# Patient Record
Sex: Female | Born: 1990 | Race: Asian | Hispanic: No | Marital: Married | Smoking: Never smoker
Health system: Southern US, Community
[De-identification: ages and names within clinical notes are randomized; demographics above are authoritative.]

## PROBLEM LIST (undated history)

## (undated) DIAGNOSIS — R87629 Unspecified abnormal cytological findings in specimens from vagina: Secondary | ICD-10-CM

## (undated) DIAGNOSIS — O24419 Gestational diabetes mellitus in pregnancy, unspecified control: Secondary | ICD-10-CM

## (undated) DIAGNOSIS — Z789 Other specified health status: Secondary | ICD-10-CM

## (undated) HISTORY — PX: LEEP: SHX91

## (undated) HISTORY — PX: NO PAST SURGERIES: SHX2092

---

## 2015-08-10 ENCOUNTER — Inpatient Hospital Stay (HOSPITAL_COMMUNITY)
Admission: AD | Admit: 2015-08-10 | Discharge: 2015-08-10 | Disposition: A | Discharge status: Home / Self Care | Payer: Medicaid Other | Source: Ambulatory Visit | Attending: Obstetrics & Gynecology | Admitting: Obstetrics & Gynecology

## 2015-08-10 ENCOUNTER — Encounter (HOSPITAL_COMMUNITY): Payer: Self-pay | Admitting: *Deleted

## 2015-08-10 DIAGNOSIS — O210 Mild hyperemesis gravidarum: Secondary | ICD-10-CM | POA: Diagnosis present

## 2015-08-10 DIAGNOSIS — Z3A11 11 weeks gestation of pregnancy: Secondary | ICD-10-CM | POA: Diagnosis not present

## 2015-08-10 DIAGNOSIS — O2341 Unspecified infection of urinary tract in pregnancy, first trimester: Secondary | ICD-10-CM | POA: Diagnosis not present

## 2015-08-10 DIAGNOSIS — O219 Vomiting of pregnancy, unspecified: Secondary | ICD-10-CM

## 2015-08-10 LAB — URINALYSIS, ROUTINE W REFLEX MICROSCOPIC
Bilirubin Urine: NEGATIVE
GLUCOSE, UA: NEGATIVE mg/dL
Ketones, ur: NEGATIVE mg/dL
LEUKOCYTES UA: NEGATIVE
Nitrite: NEGATIVE
PH: 7 (ref 5.0–8.0)
PROTEIN: NEGATIVE mg/dL
Specific Gravity, Urine: 1.01 (ref 1.005–1.030)

## 2015-08-10 LAB — POCT PREGNANCY, URINE: Preg Test, Ur: POSITIVE — AB

## 2015-08-10 LAB — URINE MICROSCOPIC-ADD ON

## 2015-08-10 MED ORDER — PROMETHAZINE HCL 25 MG PO TABS
25.0000 mg | ORAL_TABLET | Freq: Once | ORAL | Status: AC
  Administered 2015-08-10: 25 mg via ORAL
  Filled 2015-08-10: qty 1

## 2015-08-10 MED ORDER — CEPHALEXIN 500 MG PO CAPS: 500.0000 mg | ORAL_CAPSULE | Freq: Four times a day (QID) | ORAL | Status: DC

## 2015-08-10 MED ORDER — PROMETHAZINE HCL 25 MG PO TABS: 25.0000 mg | ORAL_TABLET | Freq: Four times a day (QID) | ORAL | Status: DC | PRN

## 2015-08-10 MED ORDER — CEPHALEXIN 500 MG PO CAPS
500.0000 mg | ORAL_CAPSULE | Freq: Once | ORAL | Status: AC
  Administered 2015-08-10: 500 mg via ORAL
  Filled 2015-08-10: qty 1

## 2015-08-10 NOTE — MAU Provider Note (Signed)
Chief Complaint: Emesis   First Provider Initiated Contact with Patient 08/10/15 1105      SUBJECTIVE HPI: Kelli Christian is a 24 y.o. G1P0 at 4839w2d by LMP who presents to Maternity Admissions reporting nausea, rare vomiting times several weeks and dysuria, urgency and frequency times a few months. Has tried anything for the nausea and vomiting. Is able to keep down food and fluids, it has a poor appetite and sometimes feels weak as a result. Has not started prenatal care. Denies abdominal pain, vaginal bleeding.  History reviewed. No pertinent past medical history. OB History  Gravida Para Term Preterm AB SAB TAB Ectopic Multiple Living  1             # Outcome Date GA Lbr Len/2nd Weight Sex Delivery Anes PTL Lv  1 Current              Past Surgical History  Procedure Laterality Date  . No past surgeries     Social History   Social History  . Marital Status: Single    Spouse Name: N/A  . Number of Children: N/A  . Years of Education: N/A   Occupational History  . Not on file.   Social History Main Topics  . Smoking status: Never Smoker   . Smokeless tobacco: Not on file  . Alcohol Use: No  . Drug Use: No  . Sexual Activity: Not on file   Other Topics Concern  . Not on file   Social History Narrative  . No narrative on file   No current facility-administered medications on file prior to encounter.   No current outpatient prescriptions on file prior to encounter.   No Known Allergies  I have reviewed the past Medical Hx, Surgical Hx, Social Hx, Allergies and Medications.   Review of Systems  Constitutional: Negative for fever and chills.  Gastrointestinal: Positive for nausea and vomiting. Negative for abdominal pain, diarrhea and constipation.  Genitourinary: Positive for dysuria, urgency and frequency. Negative for hematuria, vaginal discharge and pelvic pain.  Musculoskeletal: Negative for back pain.  Neurological: Positive for weakness. Negative for dizziness.     OBJECTIVE Patient Vitals for the past 24 hrs:  BP Temp Pulse Resp Height Weight  08/10/15 0958 113/74 mmHg 97.5 F (36.4 C) 92 18 5\' 2"  (1.575 m) 123 lb 9.6 oz (56.065 kg)   Constitutional: Well-developed, well-nourished female in no acute distress.  Cardiovascular: normal rate Respiratory: normal rate and effort.  GI: Abd soft, non-tender, gravid appropriate for gestational age.  Neurologic: Alert and oriented x 4.  GU: Neg CVAT.  SPECULUM EXAM: Deferred  Unable to Doppler fetal heart tones.  LAB RESULTS Results for orders placed or performed during the hospital encounter of 08/10/15 (from the past 24 hour(s))  Urinalysis, Routine w reflex microscopic ([facility])     Status: Abnormal   Collection Time: 08/10/15 10:35 AM  Result Value Ref Range   Color, Urine YELLOW YELLOW   APPearance CLEAR CLEAR   Specific Gravity, Urine 1.010 1.005 - 1.030   pH 7.0 5.0 - 8.0   Glucose, UA NEGATIVE NEGATIVE mg/dL   Hgb urine dipstick TRACE (A) NEGATIVE   Bilirubin Urine NEGATIVE NEGATIVE   Ketones, ur NEGATIVE NEGATIVE mg/dL   Protein, ur NEGATIVE NEGATIVE mg/dL   Nitrite NEGATIVE NEGATIVE   Leukocytes, UA NEGATIVE NEGATIVE  Urine microscopic-add on     Status: Abnormal   Collection Time: 08/10/15 10:35 AM  Result Value Ref Range   Squamous Epithelial /  LPF 0-5 (A) NONE SEEN   WBC, UA 0-5 0 - 5 WBC/hpf   RBC / HPF 0-5 0 - 5 RBC/hpf   Bacteria, UA RARE (A) NONE SEEN  Pregnancy, urine POC     Status: Abnormal   Collection Time: 08/10/15 10:45 AM  Result Value Ref Range   Preg Test, Ur POSITIVE (A) NEGATIVE    IMAGING Formal bedside ultrasound shows 7 week 5 day fetal pole with fetal heart rate 171.  MAU COURSE UPT, UA, Phenergan, Keflex.  MDM 24 year old pregnant female with mild nausea and vomiting of pregnancy. UTI. Ultrasound shows size less than dates fetus with positive cardiac activity. No evidence of emergent condition. Patient tolerating PO's.  ASSESSMENT 1. UTI  in pregnancy, first trimester   2. Nausea and vomiting of pregnancy, antepartum     PLAN Discharge home in stable condition. First trimester Precautions List of providers given. Urine culture pending. Encouraged small frequent meals. Follow-up Information    Follow up with Obstetrician of your choice.   Why:  Start prenatal care      Follow up with THE [facility] [place] [facility] ADMISS[facility].   Why:  As needed in emergencies   Contact information:   [address] [id number] mc [place] [address] [phone]        Medication List    TAKE these medications        cephALEXin 500 MG capsule  Commonly known as:  KEFLEX  Take 1 capsule (500 mg total) by mouth 4 (four) times daily.     promethazine 25 MG tablet  Commonly known as:  PHENERGAN  Take 1 tablet (25 mg total) by mouth every 6 (six) hours as needed.       [place], CNM 08/10/2015  11:01 AM

## 2015-08-10 NOTE — MAU Note (Signed)
Pt presents to MAU with complaints of nausea and vomiting. + home pregnancy test , LMP September 17. Denies any vaginal bleeding

## 2015-08-10 NOTE — MAU Note (Addendum)
C/o N&V; c/o intermittent UTI symptoms since October; unable to hear FHR; G1; 5837w2d by periods; does not speak [place] but has a Nurse, learning disabilitytranslator with her;

## 2015-08-10 NOTE — Discharge Instructions (Signed)
Nhi?m Trng ???ng Ti?t Ni?u (Urinary Tract Infection) Nhi?m trng ???ng ti?t ni?u (UTI) c th? pht tri?n ?? b?t c? vi? tri? na?o d?c ???ng ti?t ni?u. ???ng ti?t ni?u l h? th?ng thot n??c c?a c? th? ?? lo?i b? ch?t th?i v n??c d? th?a. ???ng ti?t ni?u bao g?m hai th?n, ni?u qu?n, bng quang v ni?u ??o. Th?n l m?t c?p c? quan hnh h?t ??u. M?i th?n co? kch th??c kho?ng b?ng bn tay c?a b?n. Chng n?m d??i x??ng s??n, m?i bn c?t s?ng c m?t qu?[facility] Nhi?m trng gy b?i vi sinh v?t, l cc sinh v?t nh?, bao g?m n?m, vi rt v vi khu?n. Nh?ng sinh v?t ny nh? t?i m?c chng ch? c th? ???c nhn th?y qua knh hi?n vi. Vi khu?n l nh?ng vi sinh v?t ph? bi?n nh?t gy ra UTI. TRI?U CH?NG Cc tri?u ch?ng c?a UTI c th? khc nhau, ty theo ?? tu?i v gi?i tnh c?a b?nh nhn v b?i v? tr nhi?m trng. Cc tri?u ch?ng ? ph? n? tr? th??ng bao g?m nhu c?u ti?u ti?n th??ng xuyn v d? d?i, c?m gic ?au v rt ? bng quang ho?c ni?u ??o khi ?i ti?u. Ph? n? v nam gi?i l?n tu?i c nhi?u kh? n?ng b? m?t m?i, run r?y v y?u, ??ng th?i b? ?au c? v ?au b?ng. S?t c th? c ngh?a l nhi?m trng ? th?n. Cc tri?u ch?ng khc c?a nhi?m trng th?n bao g?m ?au ? l?ng ho?c hai bn d??i x??ng s??n, bu?n nn v nn m?a. CH?N ?ON ?? ch?n ?on UTI, chuyn gia ch?m [place] s?c kh?e s? h?i b?n v? nh?ng tri?u ch?ng c?a b?n. Chuyn gia ch?m [place] s?c kh?e c?ng s? yu c?u cung c?p m?u n??c ti?u. M?u n??c ti?u s? ???c xt nghi?m xem c vi khu?n v cc t? bo mu tr?ng khng. Cc t? bo mu tr?ng ???c t?o b?i c? th? ?? gip ch?ng l?i nhi?m trng. ?I?U TR? Thng th??ng, UTI c th? ???c ?i?u tr? b?ng thu?c. B?i v h?u h?t nguyn nhn gy ra UTI l do nhi?m trng b?i vi khu?n, chng th??ng c th? ???c ?i?u tr? b?ng cch s? d?ng thu?c khng sinh. Vi?c l?a ch?n khng sinh v th?i gian ?i?u tr? ph? thu?c vo tri?u ch?ng v lo?i vi khu?n gy nhi?m trng. H??NG D?N CH?M [place] T?I NH  N?u b?n ? ???c k khng sinh, hy s? d?ng chng ?ng  nh? chuyn gia ch?m  s?c kh?e h??ng d?n. S? d?ng h?t thu?c ngay c? khi b?n c?m th?y kh h?n sau khi ch? dng m?t ph?n thu?c.  U?ng ?? n??c v dung d?ch ?? n??c ti?u trong ho?c c mu vng nh?t.  Trnh caffeine, tr v ?? u?ng c ga. Chng c xu h??ng kch thch bng quang.  ?i ti?u th??ng xuyn. Trnh nh?n ti?u trong th?i gian di.  ?i ti?u tr??c v sau khi quan h? tnh d?c.  Sau khi ?i ??i ti?n, ph? n? c?n lm s?ch t? tr??c ra sau. Ch? s? d?ng gi?y v? sinh m?t l?n. HY ?I KHM N?U:  B?n b? ?au l?ng.  B?n b? s?t.  Cc tri?u ch?ng c?a b?n khng b?t ??u ?? trong vng 3 ngy. HY NGAY L?P T?C ?I KHM N?U:  B?n b? ?au l?ng ho?c ?au b?ng d??i nghim tr?ng.  B?n b? ?n l?nh.  B?n b? bu?n nn ho?c nn m?a.  B?n lin t?c b? rt ho?c kh ch?u khi ?i ti?u. ??M  B?O B?N:  Hi?u cc h??ng d?n ny.  S? theo di tnh tr?ng c?a mnh.  S? yu c?u tr? gip ngay l?p t?c n?u b?n c?m th?y khng ?? ho?c tnh tr?ng tr?m tr?ng h?n.   Thng tin ny khng nh?m m?c ?ch thay th? cho l?i khuyn m chuyn gia ch?m [place] s?c kh?e ni v?i qu v?. Hy b?o ??m qu v? ph?i th?o lu?n b?t k? v?n ?? g m qu v? c v?i chuyn gia ch?m [place] s?c kh?e c?a qu v?.   Document Released: 08/26/2005 Document Revised: 04/28/2013 Elsevier Interactive Patient Education 2016 Elsevier Inc.  Nn nghn (Hyperemesis Gravidarum) Ch?ng nn nghn l m?t d?ng n?ng c?a bu?n nn v nn n?ng x?y ra trong th?i k? mang [place]. Ch?ng nn t?i t? h?n tnh tr?ng ?m nghn. N c th? khi?n cho qu v? b? bu?n nn ho?c nn su?t c? ngy trong nhi?u ngy. N c th? khi?n cho qu v? khng th? ?n v u?ng ?? th?c ?n v ch?t l?ng. Ch?ng nn th??ng x?y ra trong n?a ??u (20 tu?n ??u) c?a [place] k?. N th??ng h?t sau khi ng??i mang [place] ? n?a sau c?a [place] k?. Tuy nhin, ?i khi ch?ng nn ti?p t?c sut ton b? thai k?.  NGUYN NHN.  Nguyn nhn c?a tnh tr?ng ny khng hon ton ???c xc ??nh nh?ng ???c cho l lin quan ??n s? thay ??i hocmon c?a c? th?  khi mang [place]. N c th? do n?ng ?? hocmon thai nghn cao ho?c t?ng estrogen trong c? th?.  D?U HI?U V TRI?U CH?NG   Bu?n nn v nn m?a n?ng.  Bu?n nn khng h?t.  Nn m?a khi?n qu v? khng th? gi? ???c b?t k? th?c ?n no trong c? th?.  Gi?m cn v m?t n??c trong c? th? (m?t n??c).  Khng mu?n ?n ho?c khng thch th?c ?n m tr??c ?y qu v? r?t thch. CH?N ?ON  Chuyn gia ch?m [place] s?c kh?e c?a qu v? s? khm th?c th? v h?i v? nh?ng tri?u ch?ng c?a qu v?. Chuyn gia ch?m [place] s?c kh?e c?ng c th? yu c?u xt nghi?m mu v xt nghi?m n??c ti?u ?? ??m b?o khng c nguyn nhn no khc gy ra v?n ?? ny.  ?I?U TR?  Qu v? c th? ch? c?n thu?c ?? ki?m sot v?n ?? ny. N?u thu?c khng ki?m sot ???c hi?n t??ng bu?n nn v nn m?a, qu v? s? ???c ?i?u tr? trong b?nh vi?n ?? ng?n ch?n tnh tr?ng m?t n??c, t?ng a xt trong mu (nhi?m toan), s?t cn, v nh?ng thay ??i v? ?i?n gi?i trong c? th? c th? gy h?i cho em b trong b?ng m? (bo [place]). Qu v? c th? c?n truy?n d?ch ???ng t?nh m?ch (IV).  H??NG D?N CH?M [place] T?I NH   Ch? s? d?ng thu?c khng c?n k ??n ho?c thu?c c?n k ??n theo ch? d?n c?a chuyn gia ch?m [place] s?c kh?e.  Hy c? g?ng ?n m?t vi chi?c bnh quy ho?c bnh m n??ng vo bu?i sng tr??c khi ra kh?i gi??ng.  Trnh th?c ?n v mi lm d? dy kh ch?u.  Trnh th?c ?n bo v nhi?u gia v?.  ?n 5 - 6 b?a ?n nh? m?i ngy.  Khng u?ng trong khi ?n. U?ng gi?a cc b?a ?n.  ??i v?i ?? ?n nh?, hy ?n nh?ng lo?i th?c ph?m ch?a nhi?u protein, ch?ng h?n nh? pho mt.  ?n ho?c ht nh?ng ?? ?n, ?? u?ng c  g?ng. G?ng gip lm gi?m c?m gic bu?n nn.  Trnh chu?n b? ?? ?n. Mi th?c ?n c th? ?nh h??ng ??n kh?u v? c?a qu v?.  Trnh dng vin s?t v s?t trong vitamin t?ng h?p c?a qu v? cho ??n sau 3 - 4 thng mang [place]. Tuy nhin, hy tham kh?o  ki?n c?a chuyn gia ch?m [place] s?c kh?e tr??c khi d?ng b?t k? lo?i thu?c c s?t no ? k ??n. ?I KHM N?U:   C?n ?au b?ng c?a qu v? t?ng  ln.  Qu v? b? ?au ??u nhi?u.  Qu v? c v?n ?? v? th? l?c.  Qu v? b? s?t cn. NGAY L?P T?C ?I KHM N?U:   Qu v? khng th? gi? ???c ch?t l?ng trong ng??i.  Qu v? nn ra mu.  Qu v? b? bu?n nn v nn m?a lin t?c.  Qu v? r?t m?t m?i.  Qu v? r?t kht n??c.  Qu v? b? chng m?t ho?c b? ng?t x?u.  Qu v? b? s?t ho?c c cc tri?u ch?ng ko di h?n 2 - 3 ngy.  Qu v? b? s?t v cc tri?u ch?ng c?a qu v? ??t nhin n?ng h?n. ??M B?O QU V?:   Hi?u r cc h??ng d?n ny.  S? theo di tnh tr?ng c?a mnh.  S? yu c?u tr? gip ngay l?p t?c n?u qu v? c?m th?y khng kh?e ho?c th?y tr?m tr?ng h?n.   Thng tin ny khng nh?m m?c ?ch thay th? cho l?i khuyn m chuyn gia ch?m [place] s?c kh?e ni v?i qu v?. Hy b?o ??m qu v? ph?i th?o lu?n b?t k? v?n ?? g m qu v? c v?i chuyn gia ch?m [place] s?c kh?e c?a qu v?.   Document Released: 08/26/2005 Document Revised: 06/16/2013 Elsevier Interactive Patient Education [employer]2016 Elsevier Inc.

## 2015-08-11 LAB — CULTURE, OB URINE

## 2015-08-31 ENCOUNTER — Encounter (HOSPITAL_COMMUNITY): Payer: Self-pay | Admitting: *Deleted

## 2015-09-10 NOTE — L&D Delivery Note (Signed)
Operative Delivery Note At 11:06 PM a viable female was delivered via VBAC, Vacuum Assisted.  Presentation: vertex; Position: Left,, Occiput,, Anterior; Station: +3.  Verbal consent: unable to obtain verbal consent due to language barrier.  Risks and benefits discussed in detail.  Risks include, but are not limited to the risks of anesthesia, bleeding, infection, damage to maternal tissues, fetal cephalhematoma.  There is also the risk of inability to effect vaginal delivery of the head, or shoulder dystocia that cannot be resolved by established maneuvers, leading to the need for emergency cesarean section.  APGAR: 9, 9; weight  .   Placenta status: Intact, Spontaneous.   Cord: 3 vessels with the following complications: None.  Cord pH: pending  Anesthesia: Epidural  Instruments: kiwi Episiotomy: Median Lacerations: None Suture Repair: 3.0 vicryl rapide Est. Blood Loss (mL):    Mom to postpartum.  Baby to Couplet care / Skin to Skin.  Kelli Christian 03/17/2016, 11:25 PM

## 2015-09-20 ENCOUNTER — Encounter: Payer: Self-pay | Admitting: Certified Nurse Midwife

## 2015-10-04 ENCOUNTER — Encounter: Payer: Self-pay | Admitting: Certified Nurse Midwife

## 2015-10-04 ENCOUNTER — Ambulatory Visit (INDEPENDENT_AMBULATORY_CARE_PROVIDER_SITE_OTHER): Payer: Medicaid Other | Admitting: Certified Nurse Midwife

## 2015-10-04 ENCOUNTER — Other Ambulatory Visit (HOSPITAL_COMMUNITY)
Admission: RE | Admit: 2015-10-04 | Discharge: 2015-10-04 | Disposition: A | Discharge status: Home / Self Care | Payer: Medicaid Other | Source: Ambulatory Visit | Attending: Certified Nurse Midwife | Admitting: Certified Nurse Midwife

## 2015-10-04 ENCOUNTER — Telehealth: Payer: Self-pay | Admitting: *Deleted

## 2015-10-04 VITALS — BP 115/72 | HR 83 | Temp 98.0°F | Wt 126.1 lb

## 2015-10-04 DIAGNOSIS — Z113 Encounter for screening for infections with a predominantly sexual mode of transmission: Secondary | ICD-10-CM | POA: Diagnosis present

## 2015-10-04 DIAGNOSIS — Z01419 Encounter for gynecological examination (general) (routine) without abnormal findings: Secondary | ICD-10-CM | POA: Diagnosis not present

## 2015-10-04 DIAGNOSIS — Z124 Encounter for screening for malignant neoplasm of cervix: Secondary | ICD-10-CM

## 2015-10-04 DIAGNOSIS — O0932 Supervision of pregnancy with insufficient antenatal care, second trimester: Secondary | ICD-10-CM | POA: Diagnosis present

## 2015-10-04 DIAGNOSIS — Z3402 Encounter for supervision of normal first pregnancy, second trimester: Secondary | ICD-10-CM

## 2015-10-04 LAB — POCT URINALYSIS DIP (DEVICE)
Bilirubin Urine: NEGATIVE
Glucose, UA: NEGATIVE mg/dL
Hgb urine dipstick: NEGATIVE
Ketones, ur: NEGATIVE mg/dL
Leukocytes, UA: NEGATIVE
Nitrite: NEGATIVE
Protein, ur: NEGATIVE mg/dL
Specific Gravity, Urine: 1.02 (ref 1.005–1.030)
Urobilinogen, UA: 0.2 mg/dL (ref 0.0–1.0)
pH: 7.5 (ref 5.0–8.0)

## 2015-10-04 LAB — OB RESULTS CONSOLE GC/CHLAMYDIA: Gonorrhea: NEGATIVE

## 2015-10-04 NOTE — Telephone Encounter (Signed)
Opened in error

## 2015-10-04 NOTE — Progress Notes (Signed)
   Subjective:    Kelli Christian is a G1P0 [redacted]w[redacted]d being seen today for her first obstetrical visit.  Her obstetrical history is significant for none. Patient does intend to breast feed. Pregnancy history fully reviewed.  Patient reports nausea.  Filed Vitals:   10/04/15 0841  BP: 115/72  Pulse: 83  Temp: 98 F (36.7 C)  Weight: 126 lb 1.6 oz (57.199 kg)    HISTORY: OB History  Gravida Para Term Preterm AB SAB TAB Ectopic Multiple Living  1             # Outcome Date GA Lbr Len/2nd Weight Sex Delivery Anes PTL Lv  1 Current              History reviewed. No pertinent past medical history. Past Surgical History  Procedure Laterality Date  . No past surgeries     Family History  Problem Relation Age of Onset  . Alcohol abuse Neg Hx   . Arthritis Neg Hx   . Asthma Neg Hx   . Birth defects Neg Hx   . Cancer Neg Hx   . COPD Neg Hx   . Depression Neg Hx   . Diabetes Neg Hx   . Drug abuse Neg Hx   . Early death Neg Hx   . Hearing loss Neg Hx   . Heart disease Neg Hx   . Hyperlipidemia Neg Hx   . Hypertension Neg Hx   . Kidney disease Neg Hx   . Learning disabilities Neg Hx   . Mental illness Neg Hx   . Mental retardation Neg Hx   . Miscarriages / Stillbirths Neg Hx   . Stroke Neg Hx   . Vision loss Neg Hx   . Varicose Veins Neg Hx      Exam    Uterus:     Pelvic Exam:    Perineum: No Hemorrhoids   Vulva: normal   Vagina:  normal mucosa   pH:    Cervix: no bleeding following Pap   Adnexa:    Bony Pelvis: gynecoid  System: Breast:     Skin: normal coloration and turgor, no rashes    Neurologic: oriented, normal   Extremities: normal strength, tone, and muscle mass   HEENT    Mouth/Teeth mucous membranes moist, pharynx normal without lesions   Neck supple and no masses   Cardiovascular: regular rate and rhythm   Respiratory:  appears well, vitals normal, no respiratory distress, acyanotic, normal RR, ear and throat exam is normal, neck free of mass or  lymphadenopathy, chest clear, no wheezing, crepitations, rhonchi, normal symmetric air entry   Abdomen: soft, non-tender; bowel sounds normal; no masses,  no organomegaly   Urinary: urethral meatus normal      Assessment:    Pregnancy: G1P0 Patient Active Problem List   Diagnosis Date Noted  . Supervision of normal first pregnancy in second trimester 10/04/2015  . Late prenatal care affecting pregnancy in second trimester, antepartum 10/04/2015        Plan:     Initial labs drawn. Prenatal vitamins. Problem list reviewed and updated. Genetic Screening discussed Quad Screen: ordered.  Ultrasound discussed; fetal survey: ordered. Declined flu shot  Follow up in 4 weeks. 50% of 30 min visit spent on counseling and coordination of care.   Clemmons,Lori Grissett 10/04/2015

## 2015-10-04 NOTE — Patient Instructions (Signed)
First Trimester of Pregnancy The first trimester of pregnancy is from week 1 until the end of week 12 (months 1 through 3). A week after a sperm fertilizes an egg, the egg will implant on the wall of the uterus. This embryo will begin to develop into a baby. Genes from you and your partner are forming the baby. The female genes determine whether the baby is a boy or a girl. At 6-8 weeks, the eyes and face are formed, and the heartbeat can be seen on ultrasound. At the end of 12 weeks, all the baby's organs are formed.  Now that you are pregnant, you will want to do everything you can to have a healthy baby. Two of the most important things are to get good prenatal care and to follow your health care provider's instructions. Prenatal care is all the medical care you receive before the baby's birth. This care will help prevent, find, and treat any problems during the pregnancy and childbirth. BODY CHANGES Your body goes through many changes during pregnancy. The changes vary from woman to woman.   You may gain or lose a couple of pounds at first.  You may feel sick to your stomach (nauseous) and throw up (vomit). If the vomiting is uncontrollable, call your health care provider.  You may tire easily.  You may develop headaches that can be relieved by medicines approved by your health care provider.  You may urinate more often. Painful urination may mean you have a bladder infection.  You may develop heartburn as a result of your pregnancy.  You may develop constipation because certain hormones are causing the muscles that push waste through your intestines to slow down.  You may develop hemorrhoids or swollen, bulging veins (varicose veins).  Your breasts may begin to grow larger and become tender. Your nipples may stick out more, and the tissue that surrounds them (areola) may become darker.  Your gums may bleed and may be sensitive to brushing and flossing.  Dark spots or blotches (chloasma,  mask of pregnancy) may develop on your face. This will likely fade after the baby is born.  Your menstrual periods will stop.  You may have a loss of appetite.  You may develop cravings for certain kinds of food.  You may have changes in your emotions from day to day, such as being excited to be pregnant or being concerned that something may go wrong with the pregnancy and baby.  You may have more vivid and strange dreams.  You may have changes in your hair. These can include thickening of your hair, rapid growth, and changes in texture. Some women also have hair loss during or after pregnancy, or hair that feels dry or thin. Your hair will most likely return to normal after your baby is born. WHAT TO EXPECT AT YOUR PRENATAL VISITS During a routine prenatal visit:  You will be weighed to make sure you and the baby are growing normally.  Your blood pressure will be taken.  Your abdomen will be measured to track your baby's growth.  The fetal heartbeat will be listened to starting around week 10 or 12 of your pregnancy.  Test results from any previous visits will be discussed. Your health care provider may ask you:  How you are feeling.  If you are feeling the baby move.  If you have had any abnormal symptoms, such as leaking fluid, bleeding, severe headaches, or abdominal cramping.  If you are using any tobacco products,   including cigarettes, chewing tobacco, and electronic cigarettes.  If you have any questions. Other tests that may be performed during your first trimester include:  Blood tests to find your blood type and to check for the presence of any previous infections. They will also be used to check for low iron levels (anemia) and Rh antibodies. Later in the pregnancy, blood tests for diabetes will be done along with other tests if problems develop.  Urine tests to check for infections, diabetes, or protein in the urine.  An ultrasound to confirm the proper growth  and development of the baby.  An amniocentesis to check for possible genetic problems.  Fetal screens for spina bifida and Down syndrome.  You may need other tests to make sure you and the baby are doing well.  HIV (human immunodeficiency virus) testing. Routine prenatal testing includes screening for HIV, unless you choose not to have this test. HOME CARE INSTRUCTIONS  Medicines  Follow your health care provider's instructions regarding medicine use. Specific medicines may be either safe or unsafe to take during pregnancy.  Take your prenatal vitamins as directed.  If you develop constipation, try taking a stool softener if your health care provider approves. Diet  Eat regular, well-balanced meals. Choose a variety of foods, such as meat or vegetable-based protein, fish, milk and low-fat dairy products, vegetables, fruits, and whole grain breads and cereals. Your health care provider will help you determine the amount of weight gain that is right for you.  Avoid raw meat and uncooked cheese. These carry germs that can cause birth defects in the baby.  Eating four or five small meals rather than three large meals a day may help relieve nausea and vomiting. If you start to feel nauseous, eating a few soda crackers can be helpful. Drinking liquids between meals instead of during meals also seems to help nausea and vomiting.  If you develop constipation, eat more high-fiber foods, such as fresh vegetables or fruit and whole grains. Drink enough fluids to keep your urine clear or pale yellow. Activity and Exercise  Exercise only as directed by your health care provider. Exercising will help you:  Control your weight.  Stay in shape.  Be prepared for labor and delivery.  Experiencing pain or cramping in the lower abdomen or low back is a good sign that you should stop exercising. Check with your health care provider before continuing normal exercises.  Try to avoid standing for long  periods of time. Move your legs often if you must stand in one place for a long time.  Avoid heavy lifting.  Wear low-heeled shoes, and practice good posture.  You may continue to have sex unless your health care provider directs you otherwise. Relief of Pain or Discomfort  Wear a good support bra for breast tenderness.   Take warm sitz baths to soothe any pain or discomfort caused by hemorrhoids. Use hemorrhoid cream if your health care provider approves.   Rest with your legs elevated if you have leg cramps or low back pain.  If you develop varicose veins in your legs, wear support hose. Elevate your feet for 15 minutes, 3-4 times a day. Limit salt in your diet. Prenatal Care  Schedule your prenatal visits by the twelfth week of pregnancy. They are usually scheduled monthly at first, then more often in the last 2 months before delivery.  Write down your questions. Take them to your prenatal visits.  Keep all your prenatal visits as directed by your   health care provider. Safety  Wear your seat belt at all times when driving.  Make a list of emergency phone numbers, including numbers for family, friends, the hospital, and police and fire departments. General Tips  Ask your health care provider for a referral to a local prenatal education class. Begin classes no later than at the beginning of month 6 of your pregnancy.  Ask for help if you have counseling or nutritional needs during pregnancy. Your health care provider can offer advice or refer you to specialists for help with various needs.  Do not use hot tubs, steam rooms, or saunas.  Do not douche or use tampons or scented sanitary pads.  Do not cross your legs for long periods of time.  Avoid cat litter boxes and soil used by cats. These carry germs that can cause birth defects in the baby and possibly loss of the fetus by miscarriage or stillbirth.  Avoid all smoking, herbs, alcohol, and medicines not prescribed by  your health care provider. Chemicals in these affect the formation and growth of the baby.  Do not use any tobacco products, including cigarettes, chewing tobacco, and electronic cigarettes. If you need help quitting, ask your health care provider. You may receive counseling support and other resources to help you quit.  Schedule a dentist appointment. At home, brush your teeth with a soft toothbrush and be gentle when you floss. SEEK MEDICAL CARE IF:   You have dizziness.  You have mild pelvic cramps, pelvic pressure, or nagging pain in the abdominal area.  You have persistent nausea, vomiting, or diarrhea.  You have a bad smelling vaginal discharge.  You have pain with urination.  You notice increased swelling in your face, hands, legs, or ankles. SEEK IMMEDIATE MEDICAL CARE IF:   You have a fever.  You are leaking fluid from your vagina.  You have spotting or bleeding from your vagina.  You have severe abdominal cramping or pain.  You have rapid weight gain or loss.  You vomit blood or material that looks like coffee grounds.  You are exposed to German measles and have never had them.  You are exposed to fifth disease or chickenpox.  You develop a severe headache.  You have shortness of breath.  You have any kind of trauma, such as from a fall or a car accident.   This information is not intended to replace advice given to you by your health care provider. Make sure you discuss any questions you have with your health care provider.   Document Released: 08/20/2001 Document Revised: 09/16/2014 Document Reviewed: 07/06/2013 Elsevier Interactive Patient Education 2016 Elsevier Inc.  

## 2015-10-04 NOTE — Progress Notes (Signed)
Anatomy scan scheduled. Pt refuses flu shot.

## 2015-10-05 LAB — PRESCRIPTION MONITORING PROFILE (19 PANEL)
Amphetamine/Meth: NEGATIVE ng/mL
Barbiturate Screen, Urine: NEGATIVE ng/mL
Benzodiazepine Screen, Urine: NEGATIVE ng/mL
Buprenorphine, Urine: NEGATIVE ng/mL
Cannabinoid Scrn, Ur: NEGATIVE ng/mL
Carisoprodol, Urine: NEGATIVE ng/mL
Cocaine Metabolites: NEGATIVE ng/mL
Creatinine, Urine: 100.01 mg/dL (ref >20.0)
Fentanyl, Ur: NEGATIVE ng/mL
MDMA URINE: NEGATIVE ng/mL
Meperidine, Ur: NEGATIVE ng/mL
Methadone Screen, Urine: NEGATIVE ng/mL
Methaqualone: NEGATIVE ng/mL
Nitrites, Initial: NEGATIVE ug/mL
Opiate Screen, Urine: NEGATIVE ng/mL
Oxycodone Screen, Ur: NEGATIVE ng/mL
Phencyclidine, Ur: NEGATIVE ng/mL
Propoxyphene: NEGATIVE ng/mL
Tapentadol, urine: NEGATIVE ng/mL
Tramadol Scrn, Ur: NEGATIVE ng/mL
Zolpidem, Urine: NEGATIVE ng/mL
pH, Initial: 8.4 pH (ref 4.5–8.9)

## 2015-10-05 LAB — CULTURE, OB URINE: Colony Count: 75000

## 2015-10-05 LAB — GC/CHLAMYDIA PROBE AMP (~~LOC~~) NOT AT ARMC
Chlamydia: NEGATIVE
Neisseria Gonorrhea: NEGATIVE

## 2015-10-06 LAB — CYTOLOGY - PAP

## 2015-10-09 ENCOUNTER — Ambulatory Visit (HOSPITAL_COMMUNITY)
Admission: RE | Admit: 2015-10-09 | Discharge: 2015-10-09 | Disposition: A | Discharge status: Home / Self Care | Payer: Medicaid Other | Source: Ambulatory Visit | Attending: Certified Nurse Midwife | Admitting: Certified Nurse Midwife

## 2015-10-09 ENCOUNTER — Other Ambulatory Visit: Payer: Self-pay | Admitting: Certified Nurse Midwife

## 2015-10-09 DIAGNOSIS — O0932 Supervision of pregnancy with insufficient antenatal care, second trimester: Secondary | ICD-10-CM | POA: Diagnosis not present

## 2015-10-09 DIAGNOSIS — Z3402 Encounter for supervision of normal first pregnancy, second trimester: Secondary | ICD-10-CM

## 2015-10-09 DIAGNOSIS — Z3A16 16 weeks gestation of pregnancy: Secondary | ICD-10-CM | POA: Insufficient documentation

## 2015-10-09 DIAGNOSIS — Z363 Encounter for antenatal screening for malformations: Secondary | ICD-10-CM

## 2015-10-09 DIAGNOSIS — O283 Abnormal ultrasonic finding on antenatal screening of mother: Secondary | ICD-10-CM | POA: Diagnosis not present

## 2015-10-16 ENCOUNTER — Telehealth: Payer: Self-pay

## 2015-10-16 NOTE — Telephone Encounter (Signed)
Per Dr. Shawnie Pons, pt needs to come in for prenatal labs/Quad Screen.  Attempted to contact pt with [employer], they did not have an interpreter available at that time.  Re:  Pt has an appt scheduled on 11/01/15 which will be too late for Quad.

## 2015-10-30 NOTE — Telephone Encounter (Signed)
Pt has appt will need to follow up at next office visit.

## 2015-11-01 ENCOUNTER — Encounter: Payer: Self-pay | Admitting: Advanced Practice Midwife

## 2015-11-01 ENCOUNTER — Ambulatory Visit (INDEPENDENT_AMBULATORY_CARE_PROVIDER_SITE_OTHER): Payer: Medicaid Other | Admitting: Advanced Practice Midwife

## 2015-11-01 VITALS — BP 105/66 | HR 79 | Temp 98.4°F | Wt 128.8 lb

## 2015-11-01 DIAGNOSIS — Z3492 Encounter for supervision of normal pregnancy, unspecified, second trimester: Secondary | ICD-10-CM

## 2015-11-01 DIAGNOSIS — R938 Abnormal findings on diagnostic imaging of other specified body structures: Secondary | ICD-10-CM

## 2015-11-01 DIAGNOSIS — O283 Abnormal ultrasonic finding on antenatal screening of mother: Secondary | ICD-10-CM | POA: Diagnosis not present

## 2015-11-01 DIAGNOSIS — Z3402 Encounter for supervision of normal first pregnancy, second trimester: Secondary | ICD-10-CM

## 2015-11-01 LAB — POCT URINALYSIS DIP (DEVICE)
BILIRUBIN URINE: NEGATIVE
Glucose, UA: NEGATIVE mg/dL
HGB URINE DIPSTICK: NEGATIVE
KETONES UR: NEGATIVE mg/dL
LEUKOCYTES UA: NEGATIVE
Nitrite: NEGATIVE
PH: 8.5 — AB (ref 5.0–8.0)
Protein, ur: NEGATIVE mg/dL
Specific Gravity, Urine: 1.015 (ref 1.005–1.030)
Urobilinogen, UA: 0.2 mg/dL (ref 0.0–1.0)

## 2015-11-01 MED ORDER — PRENATAL VITAMINS PLUS 27-1 MG PO TABS: 1.0000 | ORAL_TABLET | Freq: Every day | ORAL | Status: DC

## 2015-11-01 NOTE — Progress Notes (Signed)
Late entry: Medicaid home form completed.

## 2015-11-01 NOTE — Progress Notes (Signed)
Subjective:  Kelli Christian is a 25 y.o. G1P0 at [redacted]w[redacted]d being seen today for ongoing prenatal care.  She is currently monitored for the following issues for this low-risk pregnancy and has Supervision of normal first pregnancy in second trimester and Late prenatal care affecting pregnancy in second trimester, antepartum on her problem list.  Patient reports no complaints.  Contractions: Not present. Vag. Bleeding: None.  Movement: Present. Denies leaking of fluid.   The following portions of the patient's history were reviewed and updated as appropriate: allergies, current medications, past family history, past medical history, past social history, past surgical history and problem list. Problem list updated.  Objective:   Filed Vitals:   11/01/15 0912  BP: 105/66  Pulse: 79  Temp: 98.4 F (36.9 C)  Weight: 128 lb 12.8 oz (58.423 kg)    Fetal Status: Fetal Heart Rate (bpm): 160   Movement: Present     General:  Alert, oriented and cooperative. Patient is in no acute distress.  Skin: Skin is warm and dry. No rash noted.   Cardiovascular: Normal heart rate noted  Respiratory: Normal respiratory effort, no problems with respiration noted  Abdomen: Soft, gravid, appropriate for gestational age. Pain/Pressure: Absent     Pelvic: Vag. Bleeding: None     Cervical exam deferred        Extremities: Normal range of motion.  Edema: None  Mental Status: Normal mood and affect. Normal behavior. Normal judgment and thought content.   Urinalysis: Urine Protein: Negative Urine Glucose: Negative  Assessment and Plan:  Pregnancy: G1P0 at [redacted]w[redacted]d  1. Supervision of normal pregnancy, second trimester      Discussed normal anatomy US except for choroid plexus cyst seen. Per Dr Vincenza Hews, MFM consult/genetic counseling offered.  Patient wants to talk to husband about it first.   >> he said yes >>Discussed CPC can be normal variant,but counselingoffered. - Hemoglobinopathy evaluation - Prenatal Profile -  Prenatal Vit-Fe Fumarate-FA (PRENATAL VITAMINS PLUS) 27-1 MG TABS; Take 1 tablet by mouth daily.  Dispense: 30 tablet; Refill: 6 - Flu Vaccine QUAD 36+ mos IM; Standing - Flu Vaccine QUAD 36+ mos IM  2. Supervision of normal first pregnancy in second trimester      Dates changed per last [place]. - Prenatal Vit-Fe Fumarate-FA (PRENATAL VITAMINS PLUS) 27-1 MG TABS; Take 1 tablet by mouth daily.  Dispense: 30 tablet; Refill: 6  Preterm labor symptoms and general obstetric precautions including but not limited to vaginal bleeding, contractions, leaking of fluid and fetal movement were reviewed in detail with the patient. Please refer to After Visit Summary for other counseling recommendations.  Return in 4 weeks  Aviva Signs, CNM

## 2015-11-01 NOTE — Progress Notes (Signed)
Called [employer]- no one speaking [place] available. Used in person interpreter [employer]. States never got keflex or phenergan filled. Denies nausea at present.

## 2015-11-01 NOTE — Patient Instructions (Signed)

## 2015-11-02 LAB — PRENATAL PROFILE (SOLSTAS)
Antibody Screen: NEGATIVE
BASOS ABS: 0 10*3/uL (ref 0.0–0.1)
BASOS PCT: 0 % (ref 0–1)
EOS ABS: 0.1 10*3/uL (ref 0.0–0.7)
EOS PCT: 1 % (ref 0–5)
HEMATOCRIT: 33.1 % — AB (ref 36.0–46.0)
HEMOGLOBIN: 10.9 g/dL — AB (ref 12.0–15.0)
HEP B S AG: NEGATIVE
HIV: NONREACTIVE
Lymphocytes Relative: 14 % (ref 12–46)
Lymphs Abs: 1.5 10*3/uL (ref 0.7–4.0)
MCH: 24.2 pg — AB (ref 26.0–34.0)
MCHC: 32.9 g/dL (ref 30.0–36.0)
MCV: 73.6 fL — ABNORMAL LOW (ref 78.0–100.0)
MONOS PCT: 6 % (ref 3–12)
MPV: 10.1 fL (ref 8.6–12.4)
Monocytes Absolute: 0.6 10*3/uL (ref 0.1–1.0)
NEUTROS PCT: 79 % — AB (ref 43–77)
Neutro Abs: 8.3 10*3/uL — ABNORMAL HIGH (ref 1.7–7.7)
Platelets: 331 10*3/uL (ref 150–400)
RBC: 4.5 MIL/uL (ref 3.87–5.11)
RDW: 13.7 % (ref 11.5–15.5)
RUBELLA: 13.5 {index} — AB (ref <0.90)
Rh Type: POSITIVE
WBC: 10.5 10*3/uL (ref 4.0–10.5)

## 2015-11-02 LAB — HEMOGLOBINOPATHY EVALUATION
HEMOGLOBIN OTHER: 19.1 % — AB
HGB F QUANT: 0 % (ref 0.0–2.0)
HGB S QUANTITAION: 0 %
Hgb A2 Quant: 3.4 % — ABNORMAL HIGH (ref 2.2–3.2)
Hgb A: 77.5 % — ABNORMAL LOW (ref 96.8–97.8)

## 2015-11-04 ENCOUNTER — Encounter: Payer: Self-pay | Admitting: Advanced Practice Midwife

## 2015-11-04 DIAGNOSIS — D582 Other hemoglobinopathies: Secondary | ICD-10-CM | POA: Insufficient documentation

## 2015-11-07 ENCOUNTER — Ambulatory Visit (HOSPITAL_COMMUNITY)
Admission: RE | Admit: 2015-11-07 | Discharge: 2015-11-07 | Disposition: A | Discharge status: Home / Self Care | Payer: Medicaid Other | Source: Ambulatory Visit | Attending: Advanced Practice Midwife | Admitting: Advanced Practice Midwife

## 2015-11-07 DIAGNOSIS — Z315 Encounter for genetic counseling: Secondary | ICD-10-CM | POA: Insufficient documentation

## 2015-11-07 DIAGNOSIS — Z0489 Encounter for examination and observation for other specified reasons: Secondary | ICD-10-CM

## 2015-11-07 DIAGNOSIS — Z3A20 20 weeks gestation of pregnancy: Secondary | ICD-10-CM | POA: Diagnosis not present

## 2015-11-07 DIAGNOSIS — O283 Abnormal ultrasonic finding on antenatal screening of mother: Secondary | ICD-10-CM | POA: Insufficient documentation

## 2015-11-07 DIAGNOSIS — IMO0002 Reserved for concepts with insufficient information to code with codable children: Secondary | ICD-10-CM

## 2015-11-07 DIAGNOSIS — O3503X0 Maternal care for (suspected) central nervous system malformation or damage in fetus, choroid plexus cysts, not applicable or unspecified: Secondary | ICD-10-CM | POA: Insufficient documentation

## 2015-11-07 DIAGNOSIS — O350XX0 Maternal care for (suspected) central nervous system malformation in fetus, not applicable or unspecified: Secondary | ICD-10-CM | POA: Insufficient documentation

## 2015-11-07 NOTE — Progress Notes (Signed)
Genetic Counseling  High-Risk Gestation Note  Appointment Date:  11/07/2015 Referred By: Aviva Signs, CNM Date of Birth:  Feb 04, 1991 Partner:  Kelli Christian   Pregnancy History: G1P0 Estimated Date of Delivery: 03/21/16 Estimated Gestational Age: [redacted]w[redacted]d Attending: Alpha Gula, MD   [name] and her husband, Mr. Kelli Christian, were seen for genetic counseling because of the previous ultrasound finding of choroid plexus cyst. Kelli Christian interpreter, Kelli Christian, provided in-person interpretation today.   In Summary:   Choroid plexus cyst visualized on anatomy ultrasound performed on 10/09/15 through [facility] Radiology Department  Limited fetal anatomic survey due to gestational age; no other abnormalities seen on ultrasound  Follow-up ultrasound scheduled for 11/21/15 to complete fetal anatomic survey  Reviewed CPC typically a benign, normal variant  Discussed association as marker for fetal aneuploidy; specifically Trisomy 18  Pregnancy is considered to be low risk for fetal aneuploidy given patient's age, but reviewed additional screening/testing options for fetal aneuploidy  Patient elected to pursue NIPS (Panorama) today; declined amniocentesis  Kelli Christian had anatomy ultrasound on 10/09/15 through [facility] Radiology Department. Ultrasound at that time visualized a choroid plexus cyst. No other fetal differences visualized at that time. Fetal anatomic survey was limited due to gestational age. Follow-up ultrasound is scheduled for 11/21/15 to complete fetal anatomic survey.  The ultrasound report is under separate cover.    Kelli Christian was counseled that the second trimester genetic ultrasound is targeted at identifying features associated with aneuploidy as well as other conditions.  It has evolved as a screening tool used to provide an individualized risk assessment for Down syndrome and other trisomies.  The ability of sonography to aid in the detection of aneuploidies  relies on identification of both major structural anomalies and soft markers.  She was counseled that markers have a known association with aneuploidy, which varies depending on the specific marker(s) visualized by ultrasound.    They were counseled that the choroid plexus is an area in the brain where cerebral spinal fluid, the fluid that bathes the brain and spinal cord, is made.  Cysts, or fluid filled sacs, are sometimes found in the choroid plexus of babies both before and after they are born.  We discussed that approximately 1% of pregnancies evaluated by ultrasound will show choroid plexus cyst (CPCs).  Literature suggests that CPCs are an ultrasound finding in approximately 30-50% of fetuses with trisomy 18, but are an isolated finding in less than 10% of fetuses with trisomy 25.  Kelli Christian was counseled that when a patient has other risk factors for fetal trisomy 29 (abnormal First trimester or quad screening, advanced maternal age, or another ultrasound finding), CPCs are associated with an increased risk (LR of 9) for trisomy 68.  Newer literature suggests that in the absence of other risk factors, CPCs are likely a normal variation of development or a benign finding.  CPCs are not associated with an increased risk for fetal Down syndrome.  We reviewed chromosomes, nondisjunction, and the common features and poor prognosis of trisomy 89.  Considering her maternal age of 25 y.o., the presence of CPC is not expected to significantly increase the risk for fetal Trisomy 48 above the age related risk of approximately 1 in 23,336. However, additional testing options for detection of fetal trisomy 9 were discussed.  We reviewed the availability of a follow up ultrasound to complete fetal anatomic survey and monitor fetal growth.  This appointment was scheduled today.  In addition, we reviewed the availability  of Quad screen and noninvasive prenatal screening (NIPS)/cell free DNA testing. We reviewed that  screens adjust the a priori risk for chromosome conditions to provide a pregnancy specific risk assessment. We reviewed the conditions for which each assesses, the detection rates, and false positive rates. We also discussed the diagnostic testing option of amniocentesis.  We reviewed the approximate 1 in 300-500 risk for complications, including spontaneous pregnancy loss. We discussed that currently the risk for fetal aneuploidy is estimated to be less than the associated risk for complications from amniocentesis. After consideration of all the options, she elected to proceed with NIPS (Panorama). Those results will be available in 8-10 days. She declined amniocentesis.   Individuals of [place] descent are at increased risk for certain hemoglobinopathies.  Specifically, they are at an increased risk for beta thalassemia and Hemoglobin E (Hb E).  Both of these are autosomal recessive conditions and involve abnormalities in the beta globin gene on chromosome 11.  Hb E is especially significant when combined with beta thalassemia trait (Hb E/ beta-thal).  This condition is highly variable, but may result in clinical symptoms similar to beta thalassemia major.  Hb EE (Hb E inherited from each parent) is generally benign.  Persons from [place] are also at increased risk for Kelli thalassemia, also an autosomal recessive condition.  Kelli thalassemia is different in its inheritance as there are two Kelli globin genes on each chromosome 16 (aa/aa).  A person can be a carrier of one Kelli gene mutation (aa/a-) or of more than one mutation.  A person who carriers two Kelli globin gene mutations can either carry them in cis (both on the same chromosome aa/--) or in trans (on different chromosomes a-/a-) .  Southeast Asian Kelli thalassemia carriers usually have a cis arrangement (aa/--) of their Kelli globin gene mutations.  Thus, carriers are at risk for having a child with the most severe form of Kelli  thalassemia, hydrops fetalis with Hb Barts, which is associated with an absence of Kelli globin chain synthesis as a result of deletions of all four Kelli globin genes (--/--).  There is also a non-deleted Kelli chain hemoglobin variant, Hemoglobin Constant Spring (Hb CS) that is relatively common among Southeast Asians.  Hb CS can lead to Hemoglobin H disease when inherited along with Kelli thalassemia trait (aa/--).  Hemoglobinopathy carrier rates in the [place] Asian populations are quite variable and difficult to quantitate specifically.  Thus, Screening for hemoglobinopathies is recommended for this population group by CBC and quantitative hemoglobin electrophoresis. 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 Reflex analysis is currently pending to assess for the particular hemoglobin variant. Once these results are available, we are available to discuss this in more detail with the patient, if desired. Screening may be offered to the father of the pregnancy for hemoglobin variants, given that he is reportedly also of [place] descent.   Kelli Christian was provided with written information regarding cystic fibrosis (CF) including the carrier frequency and incidence in the Asian population, the availability of carrier testing and prenatal diagnosis if indicated.  In addition, we discussed that CF is routinely screened for as part of the [place] newborn screening panel.  She declined CF testing today.   Both family histories were reviewed and found to be noncontributory for birth defects, intellectual disability, and known genetic conditions. Without further information regarding the provided family history, an accurate  genetic risk cannot be calculated. Further genetic counseling is warranted if more information is obtained.  Kelli Christian denied exposure to environmental toxins or  chemical agents. She denied the use of alcohol, tobacco or street drugs. She denied significant viral illnesses during the course of her pregnancy. Her medical and surgical histories were noncontributory.   I counseled this couple regarding the above risks and available options.  The approximate face-to-face time with the genetic counselor was 45 minutes.     Quinn Plowman, MS Certified Genetic Counselor 11/07/2015

## 2015-11-09 LAB — HGB ELECTROPHORESIS REFLEXED REPORT
HEMOGLOBIN A - HGBRFX: 78.1 % — AB (ref >96.0)
HEMOGLOBIN A2 - HGBRFX: 2.5 % (ref 1.8–3.5)
Hemoglobin E: 18.7 % — ABNORMAL HIGH
Hemoglobin F - HGBRFX: 0 % (ref <2.0)
SICKLE SOLUBILITY TEST - HGBRFX: NEGATIVE
Variant Hemoglobin - HGBRFX: 0.7 % — ABNORMAL HIGH

## 2015-11-14 ENCOUNTER — Telehealth (HOSPITAL_COMMUNITY): Payer: Self-pay | Admitting: MS"

## 2015-11-14 NOTE — Telephone Encounter (Signed)
Called Kelli Christian to discuss her prenatal cell free DNA test results which are within normal range. Left message for patient to return call via [place] Interpreter [phone] [facility].   Clydie BraunKaren Tyra Michelle 11/14/2015 4:17 PM

## 2015-11-17 ENCOUNTER — Other Ambulatory Visit (HOSPITAL_COMMUNITY): Payer: Self-pay

## 2015-11-21 ENCOUNTER — Other Ambulatory Visit (HOSPITAL_COMMUNITY): Payer: Self-pay | Admitting: Maternal and Fetal Medicine

## 2015-11-21 ENCOUNTER — Ambulatory Visit (HOSPITAL_COMMUNITY)
Admission: RE | Admit: 2015-11-21 | Discharge: 2015-11-21 | Disposition: A | Discharge status: Home / Self Care | Payer: Medicaid Other | Source: Ambulatory Visit | Attending: Family Medicine | Admitting: Family Medicine

## 2015-11-21 DIAGNOSIS — O350XX0 Maternal care for (suspected) central nervous system malformation in fetus, not applicable or unspecified: Secondary | ICD-10-CM

## 2015-11-21 DIAGNOSIS — Z36 Encounter for antenatal screening of mother: Secondary | ICD-10-CM | POA: Diagnosis not present

## 2015-11-21 DIAGNOSIS — O0932 Supervision of pregnancy with insufficient antenatal care, second trimester: Secondary | ICD-10-CM | POA: Insufficient documentation

## 2015-11-21 DIAGNOSIS — Z0489 Encounter for examination and observation for other specified reasons: Secondary | ICD-10-CM

## 2015-11-21 DIAGNOSIS — O358XX0 Maternal care for other (suspected) fetal abnormality and damage, not applicable or unspecified: Secondary | ICD-10-CM | POA: Diagnosis not present

## 2015-11-21 DIAGNOSIS — Z3A22 22 weeks gestation of pregnancy: Secondary | ICD-10-CM | POA: Insufficient documentation

## 2015-11-21 DIAGNOSIS — O3503X0 Maternal care for (suspected) central nervous system malformation or damage in fetus, choroid plexus cysts, not applicable or unspecified: Secondary | ICD-10-CM

## 2015-11-21 DIAGNOSIS — IMO0002 Reserved for concepts with insufficient information to code with codable children: Secondary | ICD-10-CM

## 2015-11-22 ENCOUNTER — Telehealth: Payer: Self-pay | Admitting: General Practice

## 2015-11-22 DIAGNOSIS — D582 Other hemoglobinopathies: Secondary | ICD-10-CM

## 2015-11-22 NOTE — Telephone Encounter (Signed)
Opened in error

## 2015-11-29 ENCOUNTER — Ambulatory Visit (INDEPENDENT_AMBULATORY_CARE_PROVIDER_SITE_OTHER): Payer: Medicaid Other | Admitting: Family Medicine

## 2015-11-29 VITALS — BP 105/57 | HR 73 | Temp 98.2°F | Wt 136.6 lb

## 2015-11-29 DIAGNOSIS — O358XX0 Maternal care for other (suspected) fetal abnormality and damage, not applicable or unspecified: Secondary | ICD-10-CM | POA: Diagnosis present

## 2015-11-29 DIAGNOSIS — O3503X1 Maternal care for (suspected) central nervous system malformation or damage in fetus, choroid plexus cysts, fetus 1: Secondary | ICD-10-CM

## 2015-11-29 DIAGNOSIS — O283 Abnormal ultrasonic finding on antenatal screening of mother: Secondary | ICD-10-CM

## 2015-11-29 DIAGNOSIS — Z3402 Encounter for supervision of normal first pregnancy, second trimester: Secondary | ICD-10-CM

## 2015-11-29 DIAGNOSIS — D582 Other hemoglobinopathies: Secondary | ICD-10-CM | POA: Diagnosis not present

## 2015-11-29 DIAGNOSIS — R938 Abnormal findings on diagnostic imaging of other specified body structures: Secondary | ICD-10-CM | POA: Diagnosis not present

## 2015-11-29 DIAGNOSIS — O350XX1 Maternal care for (suspected) central nervous system malformation in fetus, fetus 1: Secondary | ICD-10-CM

## 2015-11-29 DIAGNOSIS — O0932 Supervision of pregnancy with insufficient antenatal care, second trimester: Secondary | ICD-10-CM | POA: Diagnosis not present

## 2015-11-29 LAB — POCT URINALYSIS DIP (DEVICE)
BILIRUBIN URINE: NEGATIVE
GLUCOSE, UA: NEGATIVE mg/dL
Hgb urine dipstick: NEGATIVE
Ketones, ur: NEGATIVE mg/dL
LEUKOCYTES UA: NEGATIVE
NITRITE: NEGATIVE
Protein, ur: NEGATIVE mg/dL
Specific Gravity, Urine: 1.02 (ref 1.005–1.030)
UROBILINOGEN UA: 1 mg/dL (ref 0.0–1.0)
pH: 8 (ref 5.0–8.0)

## 2015-11-29 NOTE — Progress Notes (Signed)
Language Resources [employer]

## 2015-11-29 NOTE — Patient Instructions (Signed)

## 2015-11-29 NOTE — Progress Notes (Signed)
Subjective:  Suesan Ivancic is a 25 y.o. G1P0 at 39w6dbeing seen today for ongoing prenatal care.  She is currently monitored for the following issues for this low-risk pregnancy and has Supervision of normal first pregnancy in second trimester; Late prenatal care affecting pregnancy in second trimester, antepartum; Abnormal ultrasonic finding on antenatal screening of mother; Abnormal hemoglobin (HCC); and Choroid plexus cysts, fetal, affecting care of mother, antepartum on her problem list.  Patient reports no complaints.  Contractions: Not present. Vag. Bleeding: None.  Movement: Present. Denies leaking of fluid.   The following portions of the patient's history were reviewed and updated as appropriate: allergies, current medications, past family history, past medical history, past social history, past surgical history and problem list. Problem list updated.  Objective:   Filed Vitals:   11/29/15 0816  BP: 105/57  Pulse: 73  Temp: 98.2 F (36.8 C)  Weight: 136 lb 9.6 oz (61.961 kg)    Fetal Status: Fetal Heart Rate (bpm): 152   Movement: Present     General:  Alert, oriented and cooperative. Patient is in no acute distress.  Skin: Skin is warm and dry. No rash noted.   Cardiovascular: Normal heart rate noted  Respiratory: Normal respiratory effort, no problems with respiration noted  Abdomen: Soft, gravid, appropriate for gestational age. Pain/Pressure: Absent     Pelvic: Vag. Bleeding: None     Cervical exam deferred        Extremities: Normal range of motion.  Edema: None  Mental Status: Normal mood and affect. Normal behavior. Normal judgment and thought content.   Urinalysis:      Assessment and Plan:  Pregnancy: G1P0 at 225w6d1. Choroid plexus cysts, fetal, affecting care of mother, antepartum, fetus 1 NIP low risk 2. Abnormal hemoglobin (HCC) Met with genetic counseling 3. Abnormal ultrasonic finding on antenatal screening of mother  4. Late prenatal care affecting  pregnancy in second trimester, antepartum  5. Supervision of normal first pregnancy in second trimester Updated box Normal growth  Discussed glucola at next visit    Preterm labor symptoms and general obstetric precautions including but not limited to vaginal bleeding, contractions, leaking of fluid and fetal movement were reviewed in detail with the patient. Please refer to After Visit Summary for other counseling recommendations.  Return in 4 weeks (on 12/27/2015) for Routine prenatal care.   KiCaren MacadamMD

## 2015-12-27 ENCOUNTER — Ambulatory Visit (INDEPENDENT_AMBULATORY_CARE_PROVIDER_SITE_OTHER): Payer: Medicaid Other | Admitting: Advanced Practice Midwife

## 2015-12-27 VITALS — BP 123/65 | HR 89 | Temp 97.8°F | Wt 145.0 lb

## 2015-12-27 DIAGNOSIS — O283 Abnormal ultrasonic finding on antenatal screening of mother: Secondary | ICD-10-CM

## 2015-12-27 DIAGNOSIS — O0932 Supervision of pregnancy with insufficient antenatal care, second trimester: Secondary | ICD-10-CM | POA: Diagnosis present

## 2015-12-27 DIAGNOSIS — Z23 Encounter for immunization: Secondary | ICD-10-CM

## 2015-12-27 DIAGNOSIS — R938 Abnormal findings on diagnostic imaging of other specified body structures: Secondary | ICD-10-CM | POA: Diagnosis not present

## 2015-12-27 LAB — CBC
HEMATOCRIT: 33.7 % — AB (ref 35.0–45.0)
HEMOGLOBIN: 10.5 g/dL — AB (ref 11.7–15.5)
MCH: 22.5 pg — ABNORMAL LOW (ref 27.0–33.0)
MCHC: 31.2 g/dL — ABNORMAL LOW (ref 32.0–36.0)
MCV: 72.2 fL — ABNORMAL LOW (ref 80.0–100.0)
MPV: 10.3 fL (ref 7.5–12.5)
Platelets: 330 10*3/uL (ref 140–400)
RBC: 4.67 MIL/uL (ref 3.80–5.10)
RDW: 14.6 % (ref 11.0–15.0)
WBC: 10.1 10*3/uL (ref 3.8–10.8)

## 2015-12-27 LAB — POCT URINALYSIS DIP (DEVICE)
BILIRUBIN URINE: NEGATIVE
GLUCOSE, UA: NEGATIVE mg/dL
Hgb urine dipstick: NEGATIVE
KETONES UR: NEGATIVE mg/dL
Leukocytes, UA: NEGATIVE
Nitrite: NEGATIVE
PROTEIN: NEGATIVE mg/dL
SPECIFIC GRAVITY, URINE: 1.015 (ref 1.005–1.030)
Urobilinogen, UA: 0.2 mg/dL (ref 0.0–1.0)
pH: 7 (ref 5.0–8.0)

## 2015-12-27 NOTE — Progress Notes (Signed)
Subjective:  Kelli Christian is a 25 y.o. G1P0 at 3625w6d being seen today for ongoing prenatal care.  She is currently monitored for the following issues for this low-risk pregnancy and has Supervision of normal first pregnancy in second trimester; Late prenatal care affecting pregnancy in second trimester, antepartum; Abnormal ultrasonic finding on antenatal screening of mother; Abnormal hemoglobin (HCC); and Choroid plexus cysts, fetal, affecting care of mother, antepartum on her problem list.  Patient reports no complaints.   .  .  Movement: Present. Denies leaking of fluid.   The following portions of the patient's history were reviewed and updated as appropriate: allergies, current medications, past family history, past medical history, past social history, past surgical history and problem list. Problem list updated.  Objective:   Filed Vitals:   12/27/15 0857  BP: 123/65  Pulse: 89  Temp: 97.8 F (36.6 C)  Weight: 145 lb (65.772 kg)    Fetal Status: Fetal Heart Rate (bpm): 153 Fundal Height: 27 cm Movement: Present     General:  Alert, oriented and cooperative. Patient is in no acute distress.  Skin: Skin is warm and dry. No rash noted.   Cardiovascular: Normal heart rate noted  Respiratory: Normal respiratory effort, no problems with respiration noted  Abdomen: Soft, gravid, appropriate for gestational age. Pain/Pressure: Absent     Pelvic:       Cervical exam deferred        Extremities: Normal range of motion.     Mental Status: Normal mood and affect. Normal behavior. Normal judgment and thought content.   Urinalysis:      Assessment and Plan:  Pregnancy: G1P0 at 2625w6d  1. Need for influenza vaccination  - Flu Vaccine QUAD 36+ mos IM (Fluarix, Quad PF)  2. Late prenatal care affecting pregnancy in second trimester, antepartum  - HIV antibody - CBC - RPR - Glucose Tolerance, 1 HR (50g) - POCT urinalysis dip (device) - Tdap vaccine greater than or equal to 7yo  IM  3. Abnormal ultrasonic finding on antenatal screening of mother --Choroid plexus cysts. Genetic counseling done with normal NIPS.  Preterm labor symptoms and general obstetric precautions including but not limited to vaginal bleeding, contractions, leaking of fluid and fetal movement were reviewed in detail with the patient. Please refer to After Visit Summary for other counseling recommendations.  Return in about 2 weeks (around 01/10/2016).   Hurshel PartyLisa A Leftwich-Kirby, CNM

## 2015-12-27 NOTE — Progress Notes (Signed)
1 HOUR GTT GIVEN  

## 2015-12-28 LAB — RPR

## 2015-12-28 LAB — HIV ANTIBODY (ROUTINE TESTING W REFLEX): HIV 1&2 Ab, 4th Generation: NONREACTIVE

## 2015-12-28 LAB — GLUCOSE TOLERANCE, 1 HOUR (50G) W/O FASTING: GLUCOSE, 1 HR, GESTATIONAL: 96 mg/dL (ref <140)

## 2016-01-10 ENCOUNTER — Ambulatory Visit (INDEPENDENT_AMBULATORY_CARE_PROVIDER_SITE_OTHER): Payer: Medicaid Other | Admitting: Student

## 2016-01-10 VITALS — BP 100/49 | HR 89 | Wt 141.5 lb

## 2016-01-10 DIAGNOSIS — Z3402 Encounter for supervision of normal first pregnancy, second trimester: Secondary | ICD-10-CM

## 2016-01-10 LAB — POCT URINALYSIS DIP (DEVICE)
Bilirubin Urine: NEGATIVE
Glucose, UA: NEGATIVE mg/dL
HGB URINE DIPSTICK: NEGATIVE
KETONES UR: NEGATIVE mg/dL
Leukocytes, UA: NEGATIVE
Nitrite: NEGATIVE
PH: 7 (ref 5.0–8.0)
PROTEIN: NEGATIVE mg/dL
Specific Gravity, Urine: 1.015 (ref 1.005–1.030)
Urobilinogen, UA: 0.2 mg/dL (ref 0.0–1.0)

## 2016-01-10 NOTE — Patient Instructions (Signed)
Third Trimester of Pregnancy The third trimester is from week 29 through week 42, months 7 through 9. The third trimester is a time when the fetus is growing rapidly. At the end of the ninth month, the fetus is about 20 inches in length and weighs 6-10 pounds.  BODY CHANGES Your body goes through many changes during pregnancy. The changes vary from woman to woman.   Your weight will continue to increase. You can expect to gain 25-35 pounds (11-16 kg) by the end of the pregnancy.  You may begin to get stretch marks on your hips, abdomen, and breasts.  You may urinate more often because the fetus is moving lower into your pelvis and pressing on your bladder.  You may develop or continue to have heartburn as a result of your pregnancy.  You may develop constipation because certain hormones are causing the muscles that push waste through your intestines to slow down.  You may develop hemorrhoids or swollen, bulging veins (varicose veins).  You may have pelvic pain because of the weight gain and pregnancy hormones relaxing your joints between the bones in your pelvis. Backaches may result from overexertion of the muscles supporting your posture.  You may have changes in your hair. These can include thickening of your hair, rapid growth, and changes in texture. Some women also have hair loss during or after pregnancy, or hair that feels dry or thin. Your hair will most likely return to normal after your baby is born.  Your breasts will continue to grow and be tender. A yellow discharge may leak from your breasts called colostrum.  Your belly button may stick out.  You may feel short of breath because of your expanding uterus.  You may notice the fetus "dropping," or moving lower in your abdomen.  You may have a bloody mucus discharge. This usually occurs a few days to a week before labor begins.  Your cervix becomes thin and soft (effaced) near your due date. WHAT TO EXPECT AT YOUR PRENATAL  EXAMS  You will have prenatal exams every 2 weeks until week 36. Then, you will have weekly prenatal exams. During a routine prenatal visit:  You will be weighed to make sure you and the fetus are growing normally.  Your blood pressure is taken.  Your abdomen will be measured to track your baby's growth.  The fetal heartbeat will be listened to.  Any test results from the previous visit will be discussed.  You may have a cervical check near your due date to see if you have effaced. At around 36 weeks, your caregiver will check your cervix. At the same time, your caregiver will also perform a test on the secretions of the vaginal tissue. This test is to determine if a type of bacteria, Group B streptococcus, is present. Your caregiver will explain this further. Your caregiver may ask you:  What your birth plan is.  How you are feeling.  If you are feeling the baby move.  If you have had any abnormal symptoms, such as leaking fluid, bleeding, severe headaches, or abdominal cramping.  If you are using any tobacco products, including cigarettes, chewing tobacco, and electronic cigarettes.  If you have any questions. Other tests or screenings that may be performed during your third trimester include:  Blood tests that check for low iron levels (anemia).  Fetal testing to check the health, activity level, and growth of the fetus. Testing is done if you have certain medical conditions or if   there are problems during the pregnancy.  HIV (human immunodeficiency virus) testing. If you are at high risk, you may be screened for HIV during your third trimester of pregnancy. FALSE LABOR You may feel small, irregular contractions that eventually go away. These are called Braxton Hicks contractions, or false labor. Contractions may last for hours, days, or even weeks before true labor sets in. If contractions come at regular intervals, intensify, or become painful, it is best to be seen by your  caregiver.  SIGNS OF LABOR   Menstrual-like cramps.  Contractions that are 5 minutes apart or less.  Contractions that start on the top of the uterus and spread down to the lower abdomen and back.  A sense of increased pelvic pressure or back pain.  A watery or bloody mucus discharge that comes from the vagina. If you have any of these signs before the 37th week of pregnancy, call your caregiver right away. You need to go to the hospital to get checked immediately. HOME CARE INSTRUCTIONS   Avoid all smoking, herbs, alcohol, and unprescribed drugs. These chemicals affect the formation and growth of the baby.  Do not use any tobacco products, including cigarettes, chewing tobacco, and electronic cigarettes. If you need help quitting, ask your health care provider. You may receive counseling support and other resources to help you quit.  Follow your caregiver's instructions regarding medicine use. There are medicines that are either safe or unsafe to take during pregnancy.  Exercise only as directed by your caregiver. Experiencing uterine cramps is a good sign to stop exercising.  Continue to eat regular, healthy meals.  Wear a good support bra for breast tenderness.  Do not use hot tubs, steam rooms, or saunas.  Wear your seat belt at all times when driving.  Avoid raw meat, uncooked cheese, cat litter boxes, and soil used by cats. These carry germs that can cause birth defects in the baby.  Take your prenatal vitamins.  Take 1500-2000 mg of calcium daily starting at the 20th week of pregnancy until you deliver your baby.  Try taking a stool softener (if your caregiver approves) if you develop constipation. Eat more high-fiber foods, such as fresh vegetables or fruit and whole grains. Drink plenty of fluids to keep your urine clear or pale yellow.  Take warm sitz baths to soothe any pain or discomfort caused by hemorrhoids. Use hemorrhoid cream if your caregiver approves.  If  you develop varicose veins, wear support hose. Elevate your feet for 15 minutes, 3-4 times a day. Limit salt in your diet.  Avoid heavy lifting, wear low heal shoes, and practice good posture.  Rest a lot with your legs elevated if you have leg cramps or low back pain.  Visit your dentist if you have not gone during your pregnancy. Use a soft toothbrush to brush your teeth and be gentle when you floss.  A sexual relationship may be continued unless your caregiver directs you otherwise.  Do not travel far distances unless it is absolutely necessary and only with the approval of your caregiver.  Take prenatal classes to understand, practice, and ask questions about the labor and delivery.  Make a trial run to the hospital.  Pack your hospital bag.  Prepare the baby's nursery.  Continue to go to all your prenatal visits as directed by your caregiver. SEEK MEDICAL CARE IF:  You are unsure if you are in labor or if your water has broken.  You have dizziness.  You have   mild pelvic cramps, pelvic pressure, or nagging pain in your abdominal area.  You have persistent nausea, vomiting, or diarrhea.  You have a bad smelling vaginal discharge.  You have pain with urination. SEEK IMMEDIATE MEDICAL CARE IF:   You have a fever.  You are leaking fluid from your vagina.  You have spotting or bleeding from your vagina.  You have severe abdominal cramping or pain.  You have rapid weight loss or gain.  You have shortness of breath with chest pain.  You notice sudden or extreme swelling of your face, hands, ankles, feet, or legs.  You have not felt your baby move in over an hour.  You have severe headaches that do not go away with medicine.  You have vision changes.   This information is not intended to replace advice given to you by your health care provider. Make sure you discuss any questions you have with your health care provider.   Document Released: 08/20/2001 Document  Revised: 09/16/2014 Document Reviewed: 10/27/2012 Elsevier Interactive Patient Education 2016 Elsevier Inc.   Preterm Labor Information Preterm labor is when labor starts at less than 37 weeks of pregnancy. The normal length of a pregnancy is 39 to 41 weeks. CAUSES Often, there is no identifiable underlying cause as to why a woman goes into preterm labor. One of the most common known causes of preterm labor is infection. Infections of the uterus, cervix, vagina, amniotic sac, bladder, kidney, or even the lungs (pneumonia) can cause labor to start. Other suspected causes of preterm labor include:   Urogenital infections, such as yeast infections and bacterial vaginosis.   Uterine abnormalities (uterine shape, uterine septum, fibroids, or bleeding from the placenta).   A cervix that has been operated on (it may fail to stay closed).   Malformations in the fetus.   Multiple gestations (twins, triplets, and so on).   Breakage of the amniotic sac.  RISK FACTORS  Having a previous history of preterm labor.   Having premature rupture of membranes (PROM).   Having a placenta that covers the opening of the cervix (placenta previa).   Having a placenta that separates from the uterus (placental abruption).   Having a cervix that is too weak to hold the fetus in the uterus (incompetent cervix).   Having too much fluid in the amniotic sac (polyhydramnios).   Taking illegal drugs or smoking while pregnant.   Not gaining enough weight while pregnant.   Being younger than 18 and older than 25 years old.   Having a low socioeconomic status.   Being African American. SYMPTOMS Signs and symptoms of preterm labor include:   Menstrual-like cramps, abdominal pain, or back pain.  Uterine contractions that are regular, as frequent as six in an hour, regardless of their intensity (may be mild or painful).  Contractions that start on the top of the uterus and spread down to  the lower abdomen and back.   A sense of increased pelvic pressure.   A watery or bloody mucus discharge that comes from the vagina.  TREATMENT Depending on the length of the pregnancy and other circumstances, your health care provider may suggest bed rest. If necessary, there are medicines that can be given to stop contractions and to mature the fetal lungs. If labor happens before 34 weeks of pregnancy, a prolonged hospital stay may be recommended. Treatment depends on the condition of both you and the fetus.  WHAT SHOULD YOU DO IF YOU THINK YOU ARE IN PRETERM LABOR?   Call your health care provider right away. You will need to go to the hospital to get checked immediately. HOW CAN YOU PREVENT PRETERM LABOR IN FUTURE PREGNANCIES? You should:   Stop smoking if you smoke.  Maintain healthy weight gain and avoid chemicals and drugs that are not necessary.  Be watchful for any type of infection.  Inform your health care provider if you have a known history of preterm labor.   This information is not intended to replace advice given to you by your health care provider. Make sure you discuss any questions you have with your health care provider.   Document Released: 11/16/2003 Document Revised: 04/28/2013 Document Reviewed: 09/28/2012 Elsevier Interactive Patient Education 2016 Elsevier Inc.  

## 2016-01-10 NOTE — Progress Notes (Signed)
Subjective:  Kelli Christian is a 25 y.o. G1P0 at 2933w6d being seen today for ongoing prenatal care.  She is currently monitored for the following issues for this high-risk pregnancy and has Supervision of normal first pregnancy in second trimester; Late prenatal care affecting pregnancy in second trimester, antepartum; Abnormal ultrasonic finding on antenatal screening of mother; Abnormal hemoglobin (HCC); and Choroid plexus cysts, fetal, affecting care of mother, antepartum on her problem list.  Video [place] interpreter used for this visit.   Patient reports no complaints.  Contractions: Not present. Vag. Bleeding: None.  Movement: Present. Denies leaking of fluid.   The following portions of the patient's history were reviewed and updated as appropriate: allergies, current medications, past family history, past medical history, past social history, past surgical history and problem list. Problem list updated.  Objective:   Filed Vitals:   01/10/16 0759  BP: 100/49  Pulse: 89  Weight: 141 lb 8 oz (64.184 kg)    Fetal Status: Fetal Heart Rate (bpm): 145   Movement: Present     General:  Alert, oriented and cooperative. Patient is in no acute distress.  Skin: Skin is warm and dry. No rash noted.   Cardiovascular: Normal heart rate noted  Respiratory: Normal respiratory effort, no problems with respiration noted  Abdomen: Soft, gravid, appropriate for gestational age. Fundal height 28 cm     Pelvic: Vag. Bleeding: None     Cervical exam deferred        Extremities: Normal range of motion.  Edema: None  Mental Status: Normal mood and affect. Normal behavior. Normal judgment and thought content.   Urinalysis:      Assessment and Plan:  Pregnancy: G1P0 at 2833w6d  1. Supervision of normal first pregnancy in second trimester   Preterm labor symptoms and general obstetric precautions including but not limited to vaginal bleeding, contractions, leaking of fluid and fetal movement were  reviewed in detail with the patient. Please refer to After Visit Summary for other counseling recommendations.  Return in about 2 weeks (around 01/24/2016) for Routine OB.   Judeth HornErin Lorrayne Ismael, NP

## 2016-01-23 ENCOUNTER — Ambulatory Visit (INDEPENDENT_AMBULATORY_CARE_PROVIDER_SITE_OTHER): Payer: Medicaid Other | Admitting: Student

## 2016-01-23 VITALS — BP 113/59 | HR 88 | Wt 148.5 lb

## 2016-01-23 DIAGNOSIS — Z3402 Encounter for supervision of normal first pregnancy, second trimester: Secondary | ICD-10-CM

## 2016-01-23 LAB — POCT URINALYSIS DIP (DEVICE)
BILIRUBIN URINE: NEGATIVE
Glucose, UA: NEGATIVE mg/dL
HGB URINE DIPSTICK: NEGATIVE
Ketones, ur: NEGATIVE mg/dL
LEUKOCYTES UA: NEGATIVE
NITRITE: NEGATIVE
Protein, ur: NEGATIVE mg/dL
SPECIFIC GRAVITY, URINE: 1.015 (ref 1.005–1.030)
Urobilinogen, UA: 0.2 mg/dL (ref 0.0–1.0)
pH: 7 (ref 5.0–8.0)

## 2016-01-23 NOTE — Progress Notes (Signed)
Subjective:  Kelli Christian is a 25 y.o. G1P0 at 3563w5d being seen today for ongoing prenatal care.  She is currently monitored for the following issues for this low-risk pregnancy and has Supervision of normal first pregnancy in second trimester; Late prenatal care affecting pregnancy in second trimester, antepartum; Abnormal ultrasonic finding on antenatal screening of mother; Abnormal hemoglobin (HCC); and Choroid plexus cysts, fetal, affecting care of mother, antepartum on her problem list.  Patient reports no complaints.  Contractions: Irritability. Vag. Bleeding: None.  Movement: Present. Denies leaking of fluid.   The following portions of the patient's history were reviewed and updated as appropriate: allergies, current medications, past family history, past medical history, past social history, past surgical history and problem list. Problem list updated.  Objective:   Filed Vitals:   01/23/16 0758  BP: 113/59  Pulse: 88  Weight: 148 lb 8 oz (67.359 kg)    Fetal Status: Fetal Heart Rate (bpm): 160   Movement: Present     General:  Alert, oriented and cooperative. Patient is in no acute distress.  Skin: Skin is warm and dry. No rash noted.   Cardiovascular: Normal heart rate noted  Respiratory: Normal respiratory effort, no problems with respiration noted  Abdomen: Soft, gravid, appropriate for gestational age. Pain/Pressure: Present     Pelvic: Vag. Bleeding: None     Cervical exam deferred        Extremities: Normal range of motion.  Edema: Trace  Mental Status: Normal mood and affect. Normal behavior. Normal judgment and thought content.   Urinalysis:      Assessment and Plan:  Pregnancy: G1P0 at 3763w5d  1. Supervision of normal first pregnancy in second trimester   Preterm labor symptoms and general obstetric precautions including but not limited to vaginal bleeding, contractions, leaking of fluid and fetal movement were reviewed in detail with the patient. Please refer to  After Visit Summary for other counseling recommendations.  Return in about 2 weeks (around 02/06/2016) for Routine OB.   Judeth HornErin Finleigh Cheong, NP

## 2016-01-23 NOTE — Patient Instructions (Signed)

## 2016-01-23 NOTE — Progress Notes (Signed)
Breastfeeding tip reviewed 

## 2016-01-30 ENCOUNTER — Ambulatory Visit (INDEPENDENT_AMBULATORY_CARE_PROVIDER_SITE_OTHER): Payer: Self-pay | Admitting: Pediatrics

## 2016-01-30 DIAGNOSIS — Z7681 Expectant parent(s) prebirth pediatrician visit: Secondary | ICD-10-CM

## 2016-01-30 NOTE — Progress Notes (Signed)
Prenatal counseling for impending newborn done. Translator present

## 2016-02-06 ENCOUNTER — Ambulatory Visit (INDEPENDENT_AMBULATORY_CARE_PROVIDER_SITE_OTHER): Payer: Medicaid Other | Admitting: Student

## 2016-02-06 VITALS — BP 115/72 | HR 88 | Wt 152.7 lb

## 2016-02-06 DIAGNOSIS — Z3402 Encounter for supervision of normal first pregnancy, second trimester: Secondary | ICD-10-CM | POA: Diagnosis not present

## 2016-02-06 LAB — POCT URINALYSIS DIP (DEVICE)
Bilirubin Urine: NEGATIVE
GLUCOSE, UA: NEGATIVE mg/dL
HGB URINE DIPSTICK: NEGATIVE
Ketones, ur: NEGATIVE mg/dL
Nitrite: NEGATIVE
PROTEIN: NEGATIVE mg/dL
SPECIFIC GRAVITY, URINE: 1.015 (ref 1.005–1.030)
UROBILINOGEN UA: 0.2 mg/dL (ref 0.0–1.0)
pH: 7.5 (ref 5.0–8.0)

## 2016-02-06 NOTE — Progress Notes (Signed)
  Subjective:  Kelli Christian is a 25 y.o. G1P0 at 2637w5d being seen today for ongoing prenatal care.  She is currently monitored for the following issues for this high-risk pregnancy and has Supervision of normal first pregnancy in second trimester; Late prenatal care affecting pregnancy in second trimester, antepartum; Abnormal ultrasonic finding on antenatal screening of mother; Abnormal hemoglobin (HCC); and Choroid plexus cysts, fetal, affecting care of mother, antepartum on her problem list.  Patient reports no complaints.  Contractions: Irritability.  .  Movement: Present. Denies leaking of fluid.   The following portions of the patient's history were reviewed and updated as appropriate: allergies, current medications, past family history, past medical history, past social history, past surgical history and problem list. Problem list updated.  Objective:   Filed Vitals:   02/06/16 0828  BP: 115/72  Pulse: 88  Weight: 152 lb 11.2 oz (69.264 kg)    Fetal Status: Fetal Heart Rate (bpm): 154 Fundal Height: 32 cm Movement: Present     General:  Alert, oriented and cooperative. Patient is in no acute distress.  Skin: Skin is warm and dry. No rash noted.   Cardiovascular: Normal heart rate noted  Respiratory: Normal respiratory effort, no problems with respiration noted  Abdomen: Soft, gravid, appropriate for gestational age. Pain/Pressure: Present     Pelvic:       Cervical exam deferred        Extremities: Normal range of motion.  Edema: Mild pitting, slight indentation  Mental Status: Normal mood and affect. Normal behavior. Normal judgment and thought content.   Urinalysis: Urine Protein: Negative Urine Glucose: Negative  Assessment and Plan:  Pregnancy: G1P0 at 3637w5d  1. Supervision of normal first pregnancy in second trimester   Preterm labor symptoms and general obstetric precautions including but not limited to vaginal bleeding, contractions, leaking of fluid and fetal movement  were reviewed in detail with the patient. Please refer to After Visit Summary for other counseling recommendations.  Return in about 2 weeks (around 02/20/2016) for Routine OB.   Judeth HornErin Darrion Wyszynski, NP

## 2016-02-06 NOTE — Patient Instructions (Signed)

## 2016-02-06 NOTE — Progress Notes (Signed)
Interpreter

## 2016-02-27 ENCOUNTER — Other Ambulatory Visit (HOSPITAL_COMMUNITY)
Admission: RE | Admit: 2016-02-27 | Discharge: 2016-02-27 | Disposition: A | Discharge status: Home / Self Care | Payer: Medicaid Other | Source: Ambulatory Visit | Attending: Family Medicine | Admitting: Family Medicine

## 2016-02-27 ENCOUNTER — Ambulatory Visit (INDEPENDENT_AMBULATORY_CARE_PROVIDER_SITE_OTHER): Payer: Medicaid Other | Admitting: Family Medicine

## 2016-02-27 VITALS — BP 118/82 | HR 81 | Wt 157.0 lb

## 2016-02-27 DIAGNOSIS — O0932 Supervision of pregnancy with insufficient antenatal care, second trimester: Secondary | ICD-10-CM | POA: Diagnosis present

## 2016-02-27 DIAGNOSIS — Z113 Encounter for screening for infections with a predominantly sexual mode of transmission: Secondary | ICD-10-CM | POA: Diagnosis present

## 2016-02-27 DIAGNOSIS — O3503X1 Maternal care for (suspected) central nervous system malformation or damage in fetus, choroid plexus cysts, fetus 1: Secondary | ICD-10-CM

## 2016-02-27 DIAGNOSIS — O358XX0 Maternal care for other (suspected) fetal abnormality and damage, not applicable or unspecified: Secondary | ICD-10-CM | POA: Diagnosis not present

## 2016-02-27 DIAGNOSIS — Z3493 Encounter for supervision of normal pregnancy, unspecified, third trimester: Secondary | ICD-10-CM

## 2016-02-27 DIAGNOSIS — O350XX1 Maternal care for (suspected) central nervous system malformation in fetus, fetus 1: Secondary | ICD-10-CM

## 2016-02-27 DIAGNOSIS — Z3402 Encounter for supervision of normal first pregnancy, second trimester: Secondary | ICD-10-CM

## 2016-02-27 LAB — POCT URINALYSIS DIP (DEVICE)
Bilirubin Urine: NEGATIVE
GLUCOSE, UA: NEGATIVE mg/dL
Hgb urine dipstick: NEGATIVE
Ketones, ur: NEGATIVE mg/dL
NITRITE: NEGATIVE
PROTEIN: NEGATIVE mg/dL
Specific Gravity, Urine: 1.01 (ref 1.005–1.030)
UROBILINOGEN UA: 0.2 mg/dL (ref 0.0–1.0)
pH: 7 (ref 5.0–8.0)

## 2016-02-27 LAB — OB RESULTS CONSOLE GBS: GBS: NEGATIVE

## 2016-02-27 NOTE — Progress Notes (Signed)
Vienamese interpreter [phone] used GBS cultures today

## 2016-02-27 NOTE — Patient Instructions (Signed)
Come to the MAU (maternity admission unit) for 1) Strong contractions every 2-3 minutes for at least 1 hour that do not go away when you drink water or take a warm shower. These contractions will be so strong all you can do is breath through them 2) Vaginal bleeding- anything more than spotting 3) Loss of fluid like you broke your water 4) Decreased movement of your baby  

## 2016-02-27 NOTE — Progress Notes (Signed)
2Subjective:  Kelli Christian is a 25 y.o. G1P0 at 252w5d being seen today for ongoing prenatal care.  She is currently monitored for the following issues for this low-risk pregnancy and has Supervision of normal first pregnancy in second trimester; Late prenatal care affecting pregnancy in second trimester, antepartum; Abnormal ultrasonic finding on antenatal screening of mother; Abnormal hemoglobin (HCC); and Choroid plexus cysts, fetal, affecting care of mother, antepartum on her problem list.  Patient reports no complaints.  Contractions: Not present. Vag. Bleeding: None.  Movement: Present. Denies leaking of fluid.   The following portions of the patient's history were reviewed and updated as appropriate: allergies, current medications, past family history, past medical history, past social history, past surgical history and problem list. Problem list updated.  Objective:   Filed Vitals:   02/27/16 0808  BP: 118/82  Pulse: 81  Weight: 157 lb (71.215 kg)    Fetal Status: Fetal Heart Rate (bpm): 140   Movement: Present     General:  Alert, oriented and cooperative. Patient is in no acute distress.  Skin: Skin is warm and dry. No rash noted.   Cardiovascular: Normal heart rate noted  Respiratory: Normal respiratory effort, no problems with respiration noted  Abdomen: Soft, gravid, appropriate for gestational age. Pain/Pressure: Present     Pelvic: Cervical exam deferred        Extremities: Normal range of motion.  Edema: Mild pitting, slight indentation  Mental Status: Normal mood and affect. Normal behavior. Normal judgment and thought content.   Urinalysis: Urine Protein: Negative Urine Glucose: Negative  Assessment and Plan:  Pregnancy: G1P0 at 552w5d  1. Late prenatal care affecting pregnancy in second trimester, antepartum  2. Supervision of normal pregnancy in third trimester - updated cwh box - Culture, beta strep (group b only) - [facility]Chlamydia [facility] amp [facility]  3. Choroid plexus cysts, fetal, affecting care of mother, antepartum, fetus 1   Preterm labor symptoms and general obstetric precautions including but not limited to vaginal bleeding, contractions, leaking of fluid and fetal movement were reviewed in detail with the patient. Please refer to After Visit Summary for other counseling recommendations.  Return in about 1 week (around 03/05/2016) for Routine prenatal care.   Federico FlakeKimberly Niles Austin Herd, MD

## 2016-02-28 LAB — GC/CHLAMYDIA PROBE AMP (~~LOC~~) NOT AT ARMC
Chlamydia: NEGATIVE
Neisseria Gonorrhea: NEGATIVE

## 2016-02-29 LAB — CULTURE, BETA STREP (GROUP B ONLY)

## 2016-03-05 ENCOUNTER — Ambulatory Visit (INDEPENDENT_AMBULATORY_CARE_PROVIDER_SITE_OTHER): Payer: Medicaid Other | Admitting: Student

## 2016-03-05 VITALS — BP 118/77 | HR 76 | Wt 156.2 lb

## 2016-03-05 DIAGNOSIS — Z789 Other specified health status: Secondary | ICD-10-CM

## 2016-03-05 DIAGNOSIS — Z3403 Encounter for supervision of normal first pregnancy, third trimester: Secondary | ICD-10-CM | POA: Diagnosis present

## 2016-03-05 DIAGNOSIS — Z3402 Encounter for supervision of normal first pregnancy, second trimester: Secondary | ICD-10-CM

## 2016-03-05 LAB — POCT URINALYSIS DIP (DEVICE)
Bilirubin Urine: NEGATIVE
Glucose, UA: NEGATIVE mg/dL
Hgb urine dipstick: NEGATIVE
Ketones, ur: NEGATIVE mg/dL
Leukocytes, UA: NEGATIVE
Nitrite: NEGATIVE
Protein, ur: NEGATIVE mg/dL
Specific Gravity, Urine: 1.015 (ref 1.005–1.030)
Urobilinogen, UA: 0.2 mg/dL (ref 0.0–1.0)
pH: 7 (ref 5.0–8.0)

## 2016-03-05 NOTE — Progress Notes (Signed)
Subjective:  Kelli Christian is a 25 y.o. G1P0 at 6223w5d being seen today for ongoing prenatal care.  She is currently monitored for the following issues for this high-risk pregnancy and has Supervision of normal first pregnancy in second trimester; Late prenatal care affecting pregnancy in second trimester, antepartum; Abnormal ultrasonic finding on antenatal screening of mother; Abnormal hemoglobin (HCC); and Choroid plexus cysts, fetal, affecting care of mother, antepartum on her problem list.  Patient reports no complaints.  Contractions: Not present. Vag. Bleeding: None.  Movement: Present. Denies leaking of fluid.   The following portions of the patient's history were reviewed and updated as appropriate: allergies, current medications, past family history, past medical history, past social history, past surgical history and problem list. Problem list updated.  Objective:   Filed Vitals:   03/05/16 0834  BP: 118/77  Pulse: 76  Weight: 156 lb 3.2 oz (70.852 kg)    Fetal Status: Fetal Heart Rate (bpm): 150 Fundal Height: 37 cm Movement: Present     General:  Alert, oriented and cooperative. Patient is in no acute distress.  Skin: Skin is warm and dry. No rash noted.   Cardiovascular: Normal heart rate noted  Respiratory: Normal respiratory effort, no problems with respiration noted  Abdomen: Soft, gravid, appropriate for gestational age. Pain/Pressure: Present     Pelvic: Cervical exam deferred        Extremities: Normal range of motion.  Edema: Mild pitting, slight indentation  Mental Status: Normal mood and affect. Normal behavior. Normal judgment and thought content.   Urinalysis: Urine Protein: Negative Urine Glucose: Negative  Assessment and Plan:  Pregnancy: G1P0 at 6923w5d  1. Supervision of normal first pregnancy in second trimester   2. Language barrier affecting health care -Video interpreter used    Term labor symptoms and general obstetric precautions including but not  limited to vaginal bleeding, contractions, leaking of fluid and fetal movement were reviewed in detail with the patient. Please refer to After Visit Summary for other counseling recommendations.  Return in about 1 week (around 03/12/2016) for Routine OB.   Judeth HornErin Molley Houser, NP

## 2016-03-05 NOTE — Patient Instructions (Signed)
Braxton Hicks Contractions °Contractions of the uterus can occur throughout pregnancy. Contractions are not always a sign that you are in labor.  °WHAT ARE BRAXTON HICKS CONTRACTIONS?  °Contractions that occur before labor are called Braxton Hicks contractions, or false labor. Toward the end of pregnancy (32-34 weeks), these contractions can develop more often and may become more forceful. This is not true labor because these contractions do not result in opening (dilatation) and thinning of the cervix. They are sometimes difficult to tell apart from true labor because these contractions can be forceful and people have different pain tolerances. You should not feel embarrassed if you go to the hospital with false labor. Sometimes, the only way to tell if you are in true labor is for your health care provider to look for changes in the cervix. °If there are no prenatal problems or other health problems associated with the pregnancy, it is completely safe to be sent home with false labor and await the onset of true labor. °HOW CAN YOU TELL THE DIFFERENCE BETWEEN TRUE AND FALSE LABOR? °False Labor °· The contractions of false labor are usually shorter and not as hard as those of true labor.   °· The contractions are usually irregular.   °· The contractions are often felt in the front of the lower abdomen and in the groin.   °· The contractions may go away when you walk around or change positions while lying down.   °· The contractions get weaker and are shorter lasting as time goes on.   °· The contractions do not usually become progressively stronger, regular, and closer together as with true labor.   °True Labor °1. Contractions in true labor last 30-70 seconds, become very regular, usually become more intense, and increase in frequency.   °2. The contractions do not go away with walking.   °3. The discomfort is usually felt in the top of the uterus and spreads to the lower abdomen and low back.   °4. True labor can  be determined by your health care provider with an exam. This will show that the cervix is dilating and getting thinner.   °WHAT TO REMEMBER °· Keep up with your usual exercises and follow other instructions given by your health care provider.   °· Take medicines as directed by your health care provider.   °· Keep your regular prenatal appointments.   °· Eat and drink lightly if you think you are going into labor.   °· If Braxton Hicks contractions are making you uncomfortable:   °· Change your position from lying down or resting to walking, or from walking to resting.   °· Sit and rest in a tub of warm water.   °· Drink 2-3 glasses of water. Dehydration may cause these contractions.   °· Do slow and deep breathing several times an hour.   °WHEN SHOULD I SEEK IMMEDIATE MEDICAL CARE? °Seek immediate medical care if: °· Your contractions become stronger, more regular, and closer together.   °· You have fluid leaking or gushing from your vagina.   °· You have a fever.   °· You pass blood-tinged mucus.   °· You have vaginal bleeding.   °· You have continuous abdominal pain.   °· You have low back pain that you never had before.   °· You feel your baby's head pushing down and causing pelvic pressure.   °· Your baby is not moving as much as it used to.   °  °This information is not intended to replace advice given to you by your health care provider. Make sure you discuss any questions you have with your health care   provider. °  °Document Released: 08/26/2005 Document Revised: 08/31/2013 Document Reviewed: 06/07/2013 °Elsevier Interactive Patient Education ©2016 [employer] ° °Fetal Movement Counts °Patient Name: __________________________________________________ Patient Due Date: ____________________ °Performing a fetal movement count is highly recommended in high-risk pregnancies, but it is good for every pregnant woman to do. Your health care provider may ask you to start counting fetal movements at 28 weeks of the  pregnancy. Fetal movements often increase: °· After eating a full meal. °· After physical activity. °· After eating or drinking something sweet or cold. °· At rest. °Pay attention to when you feel the baby is most active. This will help you notice a pattern of your baby's sleep and wake cycles and what factors contribute to an increase in fetal movement. It is important to perform a fetal movement count at the same time each day when your baby is normally most active.  °HOW TO COUNT FETAL MOVEMENTS °5. Find a quiet and comfortable area to sit or lie down on your left side. Lying on your left side provides the best blood and oxygen circulation to your baby. °6. Write down the day and time on a sheet of paper or in a journal. °7. Start counting kicks, flutters, swishes, rolls, or jabs in a 2-hour period. You should feel at least 10 movements within 2 hours. °8. If you do not feel 10 movements in 2 hours, wait 2-3 hours and count again. Look for a change in the pattern or not enough counts in 2 hours. °SEEK MEDICAL CARE IF: °· You feel less than 10 counts in 2 hours, tried twice. °· There is no movement in over an hour. °· The pattern is changing or taking longer each day to reach 10 counts in 2 hours. °· You feel the baby is not moving as he or she usually does. °Date: ____________ Movements: ____________ Start time: ____________ Finish time: ____________  °Date: ____________ Movements: ____________ Start time: ____________ Finish time: ____________ °Date: ____________ Movements: ____________ Start time: ____________ Finish time: ____________ °Date: ____________ Movements: ____________ Start time: ____________ Finish time: ____________ °Date: ____________ Movements: ____________ Start time: ____________ Finish time: ____________ °Date: ____________ Movements: ____________ Start time: ____________ Finish time: ____________ °Date: ____________ Movements: ____________ Start time: ____________ Finish time:  ____________ °Date: ____________ Movements: ____________ Start time: ____________ Finish time: ____________  °Date: ____________ Movements: ____________ Start time: ____________ Finish time: ____________ °Date: ____________ Movements: ____________ Start time: ____________ Finish time: ____________ °Date: ____________ Movements: ____________ Start time: ____________ Finish time: ____________ °Date: ____________ Movements: ____________ Start time: ____________ Finish time: ____________ °Date: ____________ Movements: ____________ Start time: ____________ Finish time: ____________ °Date: ____________ Movements: ____________ Start time: ____________ Finish time: ____________ °Date: ____________ Movements: ____________ Start time: ____________ Finish time: ____________  °Date: ____________ Movements: ____________ Start time: ____________ Finish time: ____________ °Date: ____________ Movements: ____________ Start time: ____________ Finish time: ____________ °Date: ____________ Movements: ____________ Start time: ____________ Finish time: ____________ °Date: ____________ Movements: ____________ Start time: ____________ Finish time: ____________ °Date: ____________ Movements: ____________ Start time: ____________ Finish time: ____________ °Date: ____________ Movements: ____________ Start time: ____________ Finish time: ____________ °Date: ____________ Movements: ____________ Start time: ____________ Finish time: ____________  °Date: ____________ Movements: ____________ Start time: ____________ Finish time: ____________ °Date: ____________ Movements: ____________ Start time: ____________ Finish time: ____________ °Date: ____________ Movements: ____________ Start time: ____________ Finish time: ____________ °Date: ____________ Movements: ____________ Start time: ____________ Finish time: ____________ °Date: ____________ Movements: ____________ Start time: ____________ Finish time: ____________ °Date: ____________ Movements:  ____________ Start time: ____________ Finish   time: ____________ °Date: ____________ Movements: ____________ Start time: ____________ Finish time: ____________  °Date: ____________ Movements: ____________ Start time: ____________ Finish time: ____________ °Date: ____________ Movements: ____________ Start time: ____________ Finish time: ____________ °Date: ____________ Movements: ____________ Start time: ____________ Finish time: ____________ °Date: ____________ Movements: ____________ Start time: ____________ Finish time: ____________ °Date: ____________ Movements: ____________ Start time: ____________ Finish time: ____________ °Date: ____________ Movements: ____________ Start time: ____________ Finish time: ____________ °Date: ____________ Movements: ____________ Start time: ____________ Finish time: ____________  °Date: ____________ Movements: ____________ Start time: ____________ Finish time: ____________ °Date: ____________ Movements: ____________ Start time: ____________ Finish time: ____________ °Date: ____________ Movements: ____________ Start time: ____________ Finish time: ____________ °Date: ____________ Movements: ____________ Start time: ____________ Finish time: ____________ °Date: ____________ Movements: ____________ Start time: ____________ Finish time: ____________ °Date: ____________ Movements: ____________ Start time: ____________ Finish time: ____________ °Date: ____________ Movements: ____________ Start time: ____________ Finish time: ____________  °Date: ____________ Movements: ____________ Start time: ____________ Finish time: ____________ °Date: ____________ Movements: ____________ Start time: ____________ Finish time: ____________ °Date: ____________ Movements: ____________ Start time: ____________ Finish time: ____________ °Date: ____________ Movements: ____________ Start time: ____________ Finish time: ____________ °Date: ____________ Movements: ____________ Start time: ____________ Finish  time: ____________ °Date: ____________ Movements: ____________ Start time: ____________ Finish time: ____________ °Date: ____________ Movements: ____________ Start time: ____________ Finish time: ____________  °Date: ____________ Movements: ____________ Start time: ____________ Finish time: ____________ °Date: ____________ Movements: ____________ Start time: ____________ Finish time: ____________ °Date: ____________ Movements: ____________ Start time: ____________ Finish time: ____________ °Date: ____________ Movements: ____________ Start time: ____________ Finish time: ____________ °Date: ____________ Movements: ____________ Start time: ____________ Finish time: ____________ °Date: ____________ Movements: ____________ Start time: ____________ Finish time: ____________ °  °This information is not intended to replace advice given to you by your health care provider. Make sure you discuss any questions you have with your health care provider. °  °Document Released: 09/25/2006 Document Revised: 09/16/2014 Document Reviewed: 06/22/2012 °Elsevier Interactive Patient Education ©2016 Else[employer] Inc. ° °

## 2016-03-13 ENCOUNTER — Encounter: Payer: Medicaid Other | Admitting: Advanced Practice Midwife

## 2016-03-17 ENCOUNTER — Encounter (HOSPITAL_COMMUNITY): Payer: Self-pay

## 2016-03-17 ENCOUNTER — Inpatient Hospital Stay (HOSPITAL_COMMUNITY): Payer: Medicaid Other | Admitting: Anesthesiology

## 2016-03-17 ENCOUNTER — Inpatient Hospital Stay (HOSPITAL_COMMUNITY)
Admission: AD | Admit: 2016-03-17 | Discharge: 2016-03-19 | DRG: 775 | Disposition: A | Discharge status: Home / Self Care | Payer: Medicaid Other | Source: Ambulatory Visit | Attending: Obstetrics and Gynecology | Admitting: Obstetrics and Gynecology

## 2016-03-17 DIAGNOSIS — Z789 Other specified health status: Secondary | ICD-10-CM

## 2016-03-17 DIAGNOSIS — Z3A39 39 weeks gestation of pregnancy: Secondary | ICD-10-CM

## 2016-03-17 DIAGNOSIS — Z3402 Encounter for supervision of normal first pregnancy, second trimester: Secondary | ICD-10-CM

## 2016-03-17 DIAGNOSIS — O283 Abnormal ultrasonic finding on antenatal screening of mother: Secondary | ICD-10-CM

## 2016-03-17 DIAGNOSIS — O34211 Maternal care for low transverse scar from previous cesarean delivery: Principal | ICD-10-CM | POA: Diagnosis present

## 2016-03-17 DIAGNOSIS — IMO0001 Reserved for inherently not codable concepts without codable children: Secondary | ICD-10-CM

## 2016-03-17 DIAGNOSIS — O0932 Supervision of pregnancy with insufficient antenatal care, second trimester: Secondary | ICD-10-CM

## 2016-03-17 DIAGNOSIS — O350XX1 Maternal care for (suspected) central nervous system malformation in fetus, fetus 1: Secondary | ICD-10-CM

## 2016-03-17 DIAGNOSIS — O3503X1 Maternal care for (suspected) central nervous system malformation or damage in fetus, choroid plexus cysts, fetus 1: Secondary | ICD-10-CM

## 2016-03-17 HISTORY — DX: Other specified health status: Z78.9

## 2016-03-17 LAB — CBC
HEMATOCRIT: 33.2 % — AB (ref 36.0–46.0)
HEMOGLOBIN: 10.6 g/dL — AB (ref 12.0–15.0)
MCH: 20.7 pg — AB (ref 26.0–34.0)
MCHC: 31.9 g/dL (ref 30.0–36.0)
MCV: 64.7 fL — AB (ref 78.0–100.0)
PLATELETS: 273 10*3/uL (ref 150–400)
RBC: 5.13 MIL/uL — AB (ref 3.87–5.11)
RDW: 16.9 % — ABNORMAL HIGH (ref 11.5–15.5)
WBC: 12.7 10*3/uL — ABNORMAL HIGH (ref 4.0–10.5)

## 2016-03-17 LAB — TYPE AND SCREEN
ABO/RH(D): O POS
Antibody Screen: NEGATIVE
UNIT DIVISION: 0

## 2016-03-17 LAB — ABO/RH: ABO/RH(D): O POS

## 2016-03-17 MED ORDER — TERBUTALINE SULFATE 1 MG/ML IJ SOLN
0.2500 mg | Freq: Once | INTRAMUSCULAR | Status: DC | PRN
  Filled 2016-03-17: qty 1

## 2016-03-17 MED ORDER — OXYCODONE-ACETAMINOPHEN 5-325 MG PO TABS: 1.0000 | ORAL_TABLET | ORAL | Status: DC | PRN

## 2016-03-17 MED ORDER — LACTATED RINGERS IV SOLN
500.0000 mL | Freq: Once | INTRAVENOUS | Status: AC
  Administered 2016-03-17: 500 mL via INTRAVENOUS

## 2016-03-17 MED ORDER — ONDANSETRON HCL 4 MG/2ML IJ SOLN: 4.0000 mg | Freq: Four times a day (QID) | INTRAMUSCULAR | Status: DC | PRN

## 2016-03-17 MED ORDER — LIDOCAINE HCL (PF) 1 % IJ SOLN
30.0000 mL | INTRAMUSCULAR | Status: DC | PRN
  Filled 2016-03-17: qty 30

## 2016-03-17 MED ORDER — PHENYLEPHRINE 40 MCG/ML (10ML) SYRINGE FOR IV PUSH (FOR BLOOD PRESSURE SUPPORT)
80.0000 ug | PREFILLED_SYRINGE | INTRAVENOUS | Status: DC | PRN
Start: 2016-03-17 — End: 2016-03-18
  Filled 2016-03-17: qty 5

## 2016-03-17 MED ORDER — OXYTOCIN BOLUS FROM INFUSION
500.0000 mL | INTRAVENOUS | Status: DC
  Administered 2016-03-17: 500 mL via INTRAVENOUS

## 2016-03-17 MED ORDER — DIPHENHYDRAMINE HCL 50 MG/ML IJ SOLN: 12.5000 mg | INTRAMUSCULAR | Status: DC | PRN

## 2016-03-17 MED ORDER — OXYTOCIN 40 UNITS IN LACTATED RINGERS INFUSION - SIMPLE MED
2.5000 [IU]/h | INTRAVENOUS | Status: DC
  Filled 2016-03-17: qty 1000

## 2016-03-17 MED ORDER — LACTATED RINGERS IV SOLN: 500.0000 mL | INTRAVENOUS | Status: DC | PRN

## 2016-03-17 MED ORDER — LACTATED RINGERS IV SOLN
INTRAVENOUS | Status: DC
  Administered 2016-03-17 (×2): via INTRAVENOUS

## 2016-03-17 MED ORDER — SOD CITRATE-CITRIC ACID 500-334 MG/5ML PO SOLN: 30.0000 mL | ORAL | Status: DC | PRN

## 2016-03-17 MED ORDER — PHENYLEPHRINE 40 MCG/ML (10ML) SYRINGE FOR IV PUSH (FOR BLOOD PRESSURE SUPPORT)
80.0000 ug | PREFILLED_SYRINGE | INTRAVENOUS | Status: DC | PRN
  Filled 2016-03-17: qty 5
  Filled 2016-03-17: qty 10

## 2016-03-17 MED ORDER — FENTANYL CITRATE (PF) 100 MCG/2ML IJ SOLN
100.0000 ug | INTRAMUSCULAR | Status: DC | PRN
  Administered 2016-03-17 (×2): 100 ug via INTRAVENOUS
  Filled 2016-03-17 (×2): qty 2

## 2016-03-17 MED ORDER — FENTANYL 2.5 MCG/ML BUPIVACAINE 1/10 % EPIDURAL INFUSION (WH - ANES)
14.0000 mL/h | INTRAMUSCULAR | Status: DC | PRN
  Administered 2016-03-17: 12 mL/h via EPIDURAL
  Administered 2016-03-17: 14 mL/h via EPIDURAL
  Filled 2016-03-17 (×2): qty 125

## 2016-03-17 MED ORDER — EPHEDRINE 5 MG/ML INJ
10.0000 mg | INTRAVENOUS | Status: DC | PRN
  Filled 2016-03-17: qty 2

## 2016-03-17 MED ORDER — OXYTOCIN 40 UNITS IN LACTATED RINGERS INFUSION - SIMPLE MED
1.0000 m[IU]/min | INTRAVENOUS | Status: DC
  Administered 2016-03-17: 2 m[IU]/min via INTRAVENOUS

## 2016-03-17 MED ORDER — FLEET ENEMA 7-19 GM/118ML RE ENEM: 1.0000 | ENEMA | RECTAL | Status: DC | PRN

## 2016-03-17 MED ORDER — LIDOCAINE HCL (PF) 1 % IJ SOLN
INTRAMUSCULAR | Status: DC | PRN
  Administered 2016-03-17: 4 mL via EPIDURAL
  Administered 2016-03-17: 3 mL via EPIDURAL

## 2016-03-17 MED ORDER — OXYCODONE-ACETAMINOPHEN 5-325 MG PO TABS: 2.0000 | ORAL_TABLET | ORAL | Status: DC | PRN

## 2016-03-17 MED ORDER — ACETAMINOPHEN 325 MG PO TABS
650.0000 mg | ORAL_TABLET | ORAL | Status: DC | PRN
  Filled 2016-03-17: qty 2

## 2016-03-17 NOTE — Anesthesia Procedure Notes (Signed)
Epidural Patient location during procedure: OB Start time: 03/17/2016 12:26 PM  Staffing Anesthesiologist: Mal AmabileFOSTER, Jnyah Brazee Performed by: anesthesiologist   Preanesthetic Checklist Completed: patient identified, site marked, surgical consent, pre-op evaluation, timeout performed, IV checked, risks and benefits discussed and monitors and equipment checked  Epidural Patient position: sitting Prep: site prepped and draped and DuraPrep Patient monitoring: continuous pulse ox and blood pressure Approach: midline Location: L3-L4 Injection technique: LOR air  Needle:  Needle type: Tuohy  Needle gauge: 17 G Needle length: 9 cm and 9 Needle insertion depth: 4 cm Catheter type: closed end flexible Catheter size: 19 Gauge Catheter at skin depth: 9 cm Test dose: negative and Other  Assessment Events: blood not aspirated, injection not painful, no injection resistance, negative IV test and no paresthesia  Additional Notes Patient identified. Risks and benefits discussed including failed block, incomplete  Pain control, post dural puncture headache, nerve damage, paralysis, blood pressure Changes, nausea, vomiting, reactions to medications-both toxic and allergic and post Partum back pain. All questions were answered. Patient expressed understanding and wished to proceed. Sterile technique was used throughout procedure. Epidural site was Dressed with sterile barrier dressing. No paresthesias, signs of intravascular injection Or signs of intrathecal spread were encountered.  Patient was more comfortable after the epidural was dosed. Please see RN's note for documentation of vital signs and FHR which are stable.

## 2016-03-17 NOTE — Anesthesia Pain Management Evaluation Note (Signed)
  CRNA Pain Management Visit Note  Patient: Kelli Christian, 25 y.o., female  "Hello I am a [facility] of the anesthesia [facility] at [facility]. We have an anesthesia team available at all times to provide care throughout the hospital, including epidural management and anesthesia for C-section. I don't know your plan for the delivery whether it a natural birth, water birth, IV sedation, nitrous supplementation, doula or epidural, but we want to meet your pain goals."   1.Was your pain managed to your expectations on prior hospitalizations?     2.What is your expectation for pain management during this hospitalization?     3.How can we help you reach that goal?   Record the patient's initial score and the patient's pain goal.   Pain: 0  Pain Goal:0 [facility] wants you to be able to say your pain was always managed very well.  Laban EmperorMalinova,Kelli Christian 03/17/2016

## 2016-03-17 NOTE — Progress Notes (Signed)
Joellyn HaffKim Booker notified of pt cervical exam.  Provider states to put in admission orders.

## 2016-03-17 NOTE — Progress Notes (Signed)
Kelli Christian is a 25 y.o. G1P0 at 1431w3d by ultrasound admitted for active labor  Subjective:   Objective: BP 124/90 mmHg  Pulse 80  Temp(Src) 97.9 F (36.6 C) (Oral)  Resp 18  Ht 5\' 2"  (1.575 m)  Wt 150 lb (68.04 kg)  BMI 27.43 kg/m2  SpO2 98%  LMP 05/23/2015      FHT:  FHR: 135-140 bpm, variability: moderate,  accelerations:  Present,  decelerations:  Absent UC:   regular, every 3-4 minutes SVE:   Dilation: 3.5 Effacement (%): 80 Station: 0 Exam by:: Gifford ShaveYancey Luft RN   Labs: Lab Results  Component Value Date   WBC 12.7* 03/17/2016   HGB 10.6* 03/17/2016   HCT 33.2* 03/17/2016   MCV 64.7* 03/17/2016   PLT 273 03/17/2016    Assessment / Plan: Spontaneous labor, progressing normally  Labor: Progressing normally Preeclampsia:  no signs or symptoms of toxicity and intake and ouput balanced Fetal Wellbeing:  Category I Pain Control:  IV pain meds I/D:  n/a Anticipated MOD:  NSVD  Kelli Christian 03/17/2016, 11:47 AM

## 2016-03-17 NOTE — MAU Note (Signed)
Abd cramping since 3 pm. Stomach felt hard too.  No leaking. Small Mucus discharge. No bleeding. Baby moving well.

## 2016-03-17 NOTE — Anesthesia Preprocedure Evaluation (Signed)
Anesthesia Evaluation  Patient identified by MRN, date of birth, ID band Patient awake    Reviewed: Allergy & Precautions, Patient's Chart, lab work & pertinent test results  Airway Mallampati: II  TM Distance: >3 FB Neck ROM: Full    Dental no notable dental hx. (+) Teeth Intact   Pulmonary neg pulmonary ROS,    Pulmonary exam normal breath sounds clear to auscultation       Cardiovascular negative cardio ROS Normal cardiovascular exam Rhythm:Regular Rate:Normal     Neuro/Psych negative neurological ROS  negative psych ROS   GI/Hepatic negative GI ROS, Neg liver ROS,   Endo/Other  negative endocrine ROS  Renal/GU negative Renal ROS     Musculoskeletal negative musculoskeletal ROS (+)   Abdominal   Peds  Hematology  (+) anemia ,   Anesthesia Other Findings   Reproductive/Obstetrics (+) Pregnancy                             Anesthesia Physical Anesthesia Plan  ASA: II  Anesthesia Plan: Epidural   Post-op Pain Management:    Induction:   Airway Management Planned: Natural Airway  Additional Equipment:   Intra-op Plan:   Post-operative Plan:   Informed Consent: I have reviewed the patients History and Physical, chart, labs and discussed the procedure including the risks, benefits and alternatives for the proposed anesthesia with the patient or authorized representative who has indicated his/her understanding and acceptance.     Plan Discussed with: Anesthesiologist  Anesthesia Plan Comments:         Anesthesia Quick Evaluation

## 2016-03-18 ENCOUNTER — Encounter (HOSPITAL_COMMUNITY): Payer: Self-pay

## 2016-03-18 LAB — RPR: RPR: NONREACTIVE

## 2016-03-18 MED ORDER — PRENATAL MULTIVITAMIN CH
1.0000 | ORAL_TABLET | Freq: Every day | ORAL | Status: DC
  Administered 2016-03-18 – 2016-03-19 (×2): 1 via ORAL
  Filled 2016-03-18: qty 1

## 2016-03-18 MED ORDER — ONDANSETRON HCL 4 MG/2ML IJ SOLN: 4.0000 mg | INTRAMUSCULAR | Status: DC | PRN

## 2016-03-18 MED ORDER — SENNOSIDES-DOCUSATE SODIUM 8.6-50 MG PO TABS
2.0000 | ORAL_TABLET | ORAL | Status: DC
  Administered 2016-03-18: 2 via ORAL
  Filled 2016-03-18: qty 2

## 2016-03-18 MED ORDER — MEASLES, MUMPS & RUBELLA VAC ~~LOC~~ INJ
0.5000 mL | INJECTION | Freq: Once | SUBCUTANEOUS | Status: DC
  Filled 2016-03-18: qty 0.5

## 2016-03-18 MED ORDER — ZOLPIDEM TARTRATE 5 MG PO TABS: 5.0000 mg | ORAL_TABLET | Freq: Every evening | ORAL | Status: DC | PRN

## 2016-03-18 MED ORDER — SODIUM CHLORIDE 0.9% FLUSH
3.0000 mL | Freq: Two times a day (BID) | INTRAVENOUS | Status: DC
  Administered 2016-03-18: 3 mL via INTRAVENOUS

## 2016-03-18 MED ORDER — DIPHENHYDRAMINE HCL 25 MG PO CAPS: 25.0000 mg | ORAL_CAPSULE | Freq: Four times a day (QID) | ORAL | Status: DC | PRN

## 2016-03-18 MED ORDER — IBUPROFEN 600 MG PO TABS
600.0000 mg | ORAL_TABLET | Freq: Four times a day (QID) | ORAL | Status: DC
  Administered 2016-03-18 – 2016-03-19 (×6): 600 mg via ORAL
  Filled 2016-03-18 (×5): qty 1

## 2016-03-18 MED ORDER — SODIUM CHLORIDE 0.9 % IV SOLN: 250.0000 mL | INTRAVENOUS | Status: DC | PRN

## 2016-03-18 MED ORDER — ONDANSETRON HCL 4 MG PO TABS: 4.0000 mg | ORAL_TABLET | ORAL | Status: DC | PRN

## 2016-03-18 MED ORDER — COCONUT OIL OIL: 1.0000 "application " | TOPICAL_OIL | Status: DC | PRN

## 2016-03-18 MED ORDER — TETANUS-DIPHTH-ACELL PERTUSSIS 5-2.5-18.5 LF-MCG/0.5 IM SUSP
0.5000 mL | Freq: Once | INTRAMUSCULAR | Status: DC
Start: 2016-03-19 — End: 2016-03-18

## 2016-03-18 MED ORDER — BENZOCAINE-MENTHOL 20-0.5 % EX AERO
1.0000 "application " | INHALATION_SPRAY | CUTANEOUS | Status: DC | PRN
  Filled 2016-03-18: qty 56

## 2016-03-18 MED ORDER — SODIUM CHLORIDE 0.9% FLUSH: 3.0000 mL | INTRAVENOUS | Status: DC | PRN

## 2016-03-18 MED ORDER — ACETAMINOPHEN 325 MG PO TABS
650.0000 mg | ORAL_TABLET | ORAL | Status: DC | PRN
  Administered 2016-03-18 (×2): 650 mg via ORAL
  Filled 2016-03-18: qty 2

## 2016-03-18 MED ORDER — SIMETHICONE 80 MG PO CHEW: 80.0000 mg | CHEWABLE_TABLET | ORAL | Status: DC | PRN

## 2016-03-18 NOTE — Lactation Note (Signed)
This note was copied from a baby's chart. Lactation Consultation Note  Patient Name: Kelli Christian Today's Date: 03/18/2016 Reason for consult: Initial assessment Interpreter used. Baby at 18 hr of life. Mom is reporting milk bilateral nipple soreness, no skin break down noted. Mom was pulling breast over to baby then pulling breast back to look at nose after baby was latched. Had mom sit up, turn baby toward her, bring baby to the breast, and use support pillows so she can relax. Discussed baby behavior, feeding frequency, supplementing, baby belly size, voids, wt loss, breast changes, and nipple care. Demonstrated manual expression, colostrum noted bilaterally, spoon in room. Given lactation handouts. Aware of OP services and support group.     Maternal Data Has patient been taught Hand Expression?: Yes Does the patient have breastfeeding experience prior to this delivery?: No  Feeding Feeding Type: Breast Fed Nipple Type: Slow - flow Length of feed: 20 min  LATCH Score/Interventions Latch: Repeated attempts needed to sustain latch, nipple held in mouth throughout feeding, stimulation needed to elicit sucking reflex. Intervention(s): Adjust position;Breast compression  Audible Swallowing: A few with stimulation Intervention(s): Hand expression  Type of Nipple: Everted at rest and after stimulation  Comfort (Breast/Nipple): Soft / non-tender     Hold (Positioning): Full assist, staff holds infant at breast Intervention(s): Support Pillows;Position options  LATCH Score: 6  Lactation Tools Discussed/Used WIC Program: Yes   Consult Status Consult Status: Follow-up Date: 03/19/16 Follow-up type: In-patient    Kelli Christian 03/18/2016, 5:39 PM

## 2016-03-18 NOTE — Progress Notes (Signed)
Kelli Christian Kelli for  [name]" present for leader rounding. Parents describe their care as excellent.They had no concerns or questions about their care.

## 2016-03-18 NOTE — Progress Notes (Signed)
Post Partum Day 1 Subjective: no complaints, up ad lib, voiding and tolerating PO  Objective: Blood pressure 125/76, pulse 100, temperature 98.2 F (36.8 C), temperature source Oral, resp. rate 18, height 5\' 2"  (1.575 m), weight 150 lb (68.04 kg), last menstrual period 05/23/2015, SpO2 100 %, unknown if currently breastfeeding.  Physical Exam:  General: alert, cooperative, appears stated age and no distress Lochia: appropriate Uterine Fundus: firm Incision: n/a DVT Evaluation: No evidence of DVT seen on physical exam. Negative Homan's sign. No cords or calf tenderness.   Recent Labs  03/17/16 0850  HGB 10.6*  HCT 33.2*    Assessment/Plan: Plan for discharge tomorrow   LOS: 1 day   Kelli Christian 03/18/2016, 5:59 AM

## 2016-03-18 NOTE — Progress Notes (Signed)
UR chart review completed.  

## 2016-03-18 NOTE — Lactation Note (Signed)
This note was copied from a baby's chart. Lactation Consultation Note  Patient Name: Kelli Christian Today's Date: 03/18/2016  Initial visit attempted.  Language line does not have [place] interpreter.  MBU RN has seen baby breastfeeding without difficulty today.  Grandmother requesting formula supplementation.  Baby not interested in formula at this time.  Will follow up.   Maternal Data    Feeding Feeding Type: Breast Fed Length of feed: 15 min  LATCH Score/Interventions                      Lactation Tools Discussed/Used     Consult Status      Huston FoleyMOULDEN, Frantz Quattrone S 03/18/2016, 2:33 PM

## 2016-03-18 NOTE — Anesthesia Postprocedure Evaluation (Signed)
Anesthesia Post Note  Patient: Kelli Christian  Procedure(s) Performed: * No procedures listed *  Patient location during evaluation: Mother Baby Anesthesia Type: Epidural Level of consciousness: awake and alert Pain management: pain level controlled Vital Signs Assessment: post-procedure vital signs reviewed and stable Respiratory status: spontaneous breathing, nonlabored ventilation and respiratory function stable Cardiovascular status: stable Postop Assessment: no headache, no backache and epidural receding Anesthetic complications: no     Last Vitals:  Filed Vitals:   03/18/16 0215 03/18/16 0612  BP: 125/76 111/61  Pulse: 100 94  Temp: 36.8 C 36.7 C  Resp: 18 16    Last Pain:  Filed Vitals:   03/18/16 0621  PainSc: 2    Pain Goal: Patients Stated Pain Goal: 0 (03/17/16 [id number])               Junious SilkGILBERT,Taiana Temkin

## 2016-03-19 MED ORDER — DOCUSATE SODIUM 100 MG PO CAPS: 100.0000 mg | ORAL_CAPSULE | Freq: Two times a day (BID) | ORAL | Status: DC

## 2016-03-19 MED ORDER — IBUPROFEN 600 MG PO TABS: 600.0000 mg | ORAL_TABLET | Freq: Four times a day (QID) | ORAL | Status: DC | PRN

## 2016-03-19 MED ORDER — NORETHINDRONE 0.35 MG PO TABS: 1.0000 | ORAL_TABLET | Freq: Every day | ORAL | Status: DC

## 2016-03-19 NOTE — Discharge Instructions (Signed)

## 2016-03-19 NOTE — Discharge Summary (Signed)
OB Discharge Summary     Patient Name: Kelli Christian DOB: Dec 02, 1990 MRN: [id number]  Date of admission: 03/17/2016 Delivering MD: Kelli Christian   Date of discharge: 03/19/2016  Admitting diagnosis: in labor Intrauterine pregnancy: [redacted]w[redacted]Christian     Secondary diagnosis:  Active Problems:   Active labor  Additional problems: none     Discharge diagnosis: Term Pregnancy Delivered                                                                                                Post partum procedures:none  Augmentation: none  Complications: None  Hospital course:  Onset of Labor With Vaginal Delivery     25 y.o. yo G1P1001 at [redacted]w[redacted]Christian was admitted in Active Labor on 03/17/2016. Patient had an uncomplicated labor course as follows:  Membrane Rupture Time/Date: 6:00 PM ,03/17/2016   Intrapartum Procedures: Episiotomy: Median [2]                                         Lacerations:  None [1]      Vacuum delivery Patient had a delivery of a Viable infant. 03/17/2016  Information for the patient's newborn:  Kelli Christian [[id number]]  Delivery Method: VBAC, Vacuum Assisted (Filed from Delivery Summary)    Pateint had an uncomplicated postpartum course.  She is ambulating, tolerating a regular diet, passing flatus, and urinating well. Patient is discharged home in stable condition on 03/19/2016.    Physical exam  Filed Vitals:   03/18/16 0612 03/18/16 1330 03/18/16 1745 03/19/16 0538  BP: 111/61 116/66 122/70 100/57  Pulse: 94 91 92 78  Temp: 98 F (36.7 C) 98.6 F (37 C) 98.5 F (36.9 C) 97.6 F (36.4 C)  TempSrc:  Oral Oral Oral  Resp: Height:      Weight:      SpO2:  98%     General: alert, cooperative and no distress Lochia: appropriate Uterine Fundus: firm Incision: N/A DVT Evaluation: No evidence of DVT seen on physical exam. No significant calf/ankle edema. Labs: Lab Results  Component Value Date   WBC 12.7* 03/17/2016   HGB 10.6* 03/17/2016   HCT 33.2*  03/17/2016   MCV 64.7* 03/17/2016   PLT 273 03/17/2016   No flowsheet data found.  Discharge instruction: per After Visit Summary and "Baby and Me Booklet".  After visit meds:    Medication List    TAKE these medications        docusate sodium 100 MG capsule  Commonly known as:  COLACE  Take 1 capsule (100 mg total) by mouth 2 (two) times daily.     ibuprofen 600 MG tablet  Commonly known as:  ADVIL,MOTRIN  Take 1 tablet (600 mg total) by mouth every 6 (six) hours as needed for mild pain or moderate pain.     norethindrone 0.35 MG tablet  Commonly known as:  MICRONOR,CAMILA,ERRIN  Take 1 tablet (0.35 mg total) by mouth daily.     PRENATAL  VITAMINS PLUS 27-1 MG Tabs  Take 1 tablet by mouth daily.        Diet: routine diet  Activity: Advance as tolerated. Pelvic rest for 6 weeks.   Outpatient follow up:6 weeks Follow up Appt:No future appointments. Follow up Visit:No Follow-up on file.  Postpartum contraception: Progesterone only pills  Newborn Data: Live born female  Birth Weight: 6 lb 14.6 oz (3135 g) APGAR: 9, 9  Baby Feeding: Breast Disposition:home with mother   03/19/2016 Kelli MuseKate Timberlake, MD   OB FELLOW DISCHARGE ATTESTATION  I have seen and examined this patient and agree with above documentation in the resident's note.   Kelli BilisNoah B Andalyn Heckstall, MD 3:57 PM

## 2016-03-20 ENCOUNTER — Encounter: Payer: Medicaid Other | Admitting: Certified Nurse Midwife

## 2016-03-30 NOTE — H&P (Signed)
OB Discharge Summary   Patient Name: Kelli Christian DOB: 11/08/1982 MRN: [id number]  Date of admission: 03/22/2016 Delivering MD: HARRAWAY-SMITH, CAROLYN   Date of discharge: 03/25/2016  Admitting diagnosis: DIRECT ADMIT INDUCTION Intrauterine pregnancy: [redacted]w[redacted]d  Secondary diagnosis: Active Problems:  Diabetes (HCC)  NSVD (normal spontaneous vaginal delivery)  Additional problems: suspected macrosomia  Discharge diagnosis: Term Pregnancy Delivered and GDM A1    Post partum procedures:none  Augmentation: AROM, Pitocin, Cytotec and Foley Balloon  Complications: None  Hospital course: Induction of Labor With Vaginal Delivery  25 y.o. yo G4P3013 at [redacted]w[redacted]d was admitted to the hospital 03/22/2016 for induction of labor. Indication for induction: A1 DM. Patient had an uncomplicated labor course as follows: Membrane Rupture Time/Date: 9:29 PM ,03/22/2016   Intrapartum Procedures: Episiotomy: None [1]   Lacerations: None [1]  Patient had delivery of a Viable infant.  Information for the patient's newborn:  Christian, Kelli Christian [[id number]]  Delivery Method: Vaginal, Spontaneous Delivery (Filed from Delivery Summary)   03/23/2016  Details of delivery can be found in separate delivery note. Patient had a routine postpartum course. Patient is discharged home 03/25/2016.   Physical exam  Filed Vitals:   03/23/16 2314 03/24/16 0527 03/24/16 1828 03/25/16 0545  BP: 126/70 126/75 129/67 133/80  Pulse: 78 67 83 82  Temp: 98 F (36.7 C) 98.1 F (36.7 C) 98.1 F (36.7 C) 97.5 F (36.4 C)  TempSrc: Oral Oral Oral Oral  Resp: 18 18 18 18  Height:      Weight:      SpO2:   99%    General: alert, cooperative and no  distress Lochia: appropriate Uterine Fundus: firm Incision: N/A DVT Evaluation: No evidence of DVT seen on physical exam. No significant calf/ankle edema. Labs:  Recent Labs    Lab Results  Component Value Date   WBC 10.6* 03/22/2016   HGB 13.0 03/22/2016   HCT 36.2 03/22/2016   MCV 85.6 03/22/2016   PLT 155 03/22/2016     CMP Latest Ref Rng 03/01/2016  Glucose 65 - 104 mg/dL 84  BUN 7 - 25 mg/dL -  Creatinine 0.50 - 1.10 mg/dL -  Sodium 135 - 146 mmol/L -  Potassium 3.5 - 5.3 mmol/L -  Chloride 98 - 110 mmol/L -  CO2 20 - 31 mmol/L -  Calcium 8.6 - 10.2 mg/dL -  Total Protein 6.1 - 8.1 g/dL -  Total Bilirubin 0.2 - 1.2 mg/dL -  Alkaline Phos 33 - 115 U/L -  AST 10 - 30 U/L -  ALT 6 - 29 U/L -    Discharge instruction: per After Visit Summary and "Baby and Me Booklet".  After visit meds:    Medication List    STOP taking these medications       ACCU-CHEK FASTCLIX LANCETS Misc     ACCU-CHEK NANO SMARTVIEW w/Device Kit     acetaminophen 500 MG tablet  Commonly known as: TYLENOL     glucose blood test strip      TAKE these medications       ibuprofen 600 MG tablet  Commonly known as: ADVIL,MOTRIN  Take 1 tablet (600 mg total) by mouth every 6 (six) hours as needed.     levothyroxine 50 MCG tablet  Commonly known as: SYNTHROID  Take 1 tablet (50 mcg total) by mouth daily before breakfast.     multivitamin-prenatal 27-0.8 MG Tabs tablet  Take 1 tablet by mouth daily.        Diet: routine   diet  Activity: Advance as tolerated. Pelvic rest for 6 weeks.   Outpatient follow up:6 weeks Follow up Appt:No future appointments. Follow up Visit:No Follow-up on file.  Postpartum contraception: Condoms  Newborn Data: Live born female  Birth Weight: 9 lb 3.3 oz (4175 g) APGAR: 7, 9  Baby Feeding: Breast Disposition:home with  mother   03/25/2016 Kate Timberlake, MD      

## 2016-05-06 ENCOUNTER — Encounter: Payer: Self-pay | Admitting: Obstetrics and Gynecology

## 2016-05-06 ENCOUNTER — Ambulatory Visit: Payer: Medicaid Other | Admitting: Obstetrics and Gynecology

## 2016-05-06 NOTE — Progress Notes (Signed)
Patient did not keep today's postpartum visit appointment.  Kelli Christian, Jr MD Attending Center for [facility] Midwife(Faculty Practice)

## 2016-09-09 NOTE — L&D Delivery Note (Signed)
Delivery Note At  a viable female was delivered via NSVD   (Presentation: OA ;  ).  APGAR:9 ,9 ; weight  2250 grams.  Patient was crowning for a long time but not making much progress with decent maternal effort. Kiwi vaccuum placed at flexion point but suction wasn't great and one pop off. Dr. Vergie Living and I decided the issue was more dystocia from a tight introitus because a band could be palpated. A right mediolateral episiotomy was cut and infant delivered with ease without vacuum on the next contraction. Infant immediately placed on maternal abdomen; stimulated and suctioned. Cord clamped and cut by RN. Placenta status: Delivered intact with gentle traction; 3 VC.  Cord:  with the following complications: .  Cord pH: NA  Anesthesia:  epidural Episiotomy:  right media-lateral Lacerations:  2nd degree and right labial; rectal exam negative  Suture Repair: 3.0 vicryl Est. Blood Loss (mL):  150  Mom to postpartum.  Baby to Couplet care / Skin to Skin.  Kelli Christian 06/21/2017, 1:44 PM   Attestation of Attending Supervision of Certified Nurse Midwife: Evaluation and management procedures were performed by the CNM under my supervision.  I have seen and examined the patient,  reviewed the CNM's note and chart, and I agree with the management and plan.   Cornelia Copa MD Attending Center for [facility] Midwife)

## 2016-11-25 ENCOUNTER — Encounter: Payer: Self-pay | Admitting: Family Medicine

## 2016-11-25 ENCOUNTER — Ambulatory Visit (INDEPENDENT_AMBULATORY_CARE_PROVIDER_SITE_OTHER): Payer: Self-pay | Admitting: *Deleted

## 2016-11-25 DIAGNOSIS — Z3200 Encounter for pregnancy test, result unknown: Secondary | ICD-10-CM

## 2016-11-25 DIAGNOSIS — Z3201 Encounter for pregnancy test, result positive: Secondary | ICD-10-CM

## 2016-11-25 LAB — POCT PREGNANCY, URINE: Preg Test, Ur: POSITIVE — AB

## 2016-11-25 NOTE — Progress Notes (Signed)
Here for pregnancy test which was positive. Called   [place] interpreter - none available for [place]. Used friend who was with her who was an interpreter in the past. States LMP 08/28/16. States has been feeling nausea and not good in her stomach-feels heavy . States was having regular periods until December. This would make her be [id number]-with EDD 06/04/17. Would like to get prenatal care - explained not sure if we are accepting new patients - but will send to registar and they will assist her with information if we are not accepting new patients. States was on birth control but wasn't taking it everyday and stopped in January because she thought she might be pregnant.

## 2017-01-06 DIAGNOSIS — Z3482 Encounter for supervision of other normal pregnancy, second trimester: Secondary | ICD-10-CM | POA: Diagnosis not present

## 2017-01-06 LAB — OB RESULTS CONSOLE RUBELLA ANTIBODY, IGM: Rubella: IMMUNE

## 2017-01-06 LAB — OB RESULTS CONSOLE GC/CHLAMYDIA
Chlamydia: NEGATIVE
Gonorrhea: NEGATIVE

## 2017-01-06 LAB — OB RESULTS CONSOLE HIV ANTIBODY (ROUTINE TESTING): HIV: NONREACTIVE

## 2017-01-06 LAB — OB RESULTS CONSOLE HEPATITIS B SURFACE ANTIGEN: Hepatitis B Surface Ag: NEGATIVE

## 2017-05-30 LAB — OB RESULTS CONSOLE GBS: STREP GROUP B AG: NEGATIVE

## 2017-06-21 ENCOUNTER — Inpatient Hospital Stay (HOSPITAL_COMMUNITY)
Admission: AD | Admit: 2017-06-21 | Discharge: 2017-06-22 | DRG: 807 | Disposition: A | Discharge status: Home / Self Care | Payer: Medicaid Other | Source: Ambulatory Visit | Attending: Obstetrics and Gynecology | Admitting: Obstetrics and Gynecology

## 2017-06-21 ENCOUNTER — Encounter (HOSPITAL_COMMUNITY): Payer: Self-pay

## 2017-06-21 ENCOUNTER — Inpatient Hospital Stay (HOSPITAL_COMMUNITY): Payer: Medicaid Other | Admitting: Anesthesiology

## 2017-06-21 DIAGNOSIS — Z3490 Encounter for supervision of normal pregnancy, unspecified, unspecified trimester: Secondary | ICD-10-CM

## 2017-06-21 DIAGNOSIS — O9902 Anemia complicating childbirth: Principal | ICD-10-CM | POA: Diagnosis present

## 2017-06-21 DIAGNOSIS — Z3A39 39 weeks gestation of pregnancy: Secondary | ICD-10-CM | POA: Diagnosis not present

## 2017-06-21 DIAGNOSIS — Z23 Encounter for immunization: Secondary | ICD-10-CM

## 2017-06-21 DIAGNOSIS — D649 Anemia, unspecified: Secondary | ICD-10-CM | POA: Diagnosis present

## 2017-06-21 DIAGNOSIS — O26893 Other specified pregnancy related conditions, third trimester: Secondary | ICD-10-CM | POA: Diagnosis present

## 2017-06-21 LAB — CBC
HCT: 32.8 % — ABNORMAL LOW (ref 36.0–46.0)
Hemoglobin: 10.3 g/dL — ABNORMAL LOW (ref 12.0–15.0)
MCH: 19.3 pg — AB (ref 26.0–34.0)
MCHC: 31.4 g/dL (ref 30.0–36.0)
MCV: 61.5 fL — ABNORMAL LOW (ref 78.0–100.0)
PLATELETS: 303 10*3/uL (ref 150–400)
RBC: 5.33 MIL/uL — AB (ref 3.87–5.11)
RDW: 18 % — ABNORMAL HIGH (ref 11.5–15.5)
WBC: 10.5 10*3/uL (ref 4.0–10.5)

## 2017-06-21 LAB — TYPE AND SCREEN
ABO/RH(D): O POS
Antibody Screen: NEGATIVE

## 2017-06-21 LAB — POCT FERN TEST: POCT FERN TEST: POSITIVE

## 2017-06-21 LAB — RPR: RPR Ser Ql: NONREACTIVE

## 2017-06-21 MED ORDER — SOD CITRATE-CITRIC ACID 500-334 MG/5ML PO SOLN: 30.0000 mL | ORAL | Status: DC | PRN

## 2017-06-21 MED ORDER — PRENATAL MULTIVITAMIN CH
1.0000 | ORAL_TABLET | Freq: Every day | ORAL | Status: DC
  Administered 2017-06-21 – 2017-06-22 (×2): 1 via ORAL
  Filled 2017-06-21 (×2): qty 1

## 2017-06-21 MED ORDER — DIPHENHYDRAMINE HCL 25 MG PO CAPS: 25.0000 mg | ORAL_CAPSULE | Freq: Four times a day (QID) | ORAL | Status: DC | PRN

## 2017-06-21 MED ORDER — WITCH HAZEL-GLYCERIN EX PADS: 1.0000 "application " | MEDICATED_PAD | CUTANEOUS | Status: DC | PRN

## 2017-06-21 MED ORDER — SENNOSIDES-DOCUSATE SODIUM 8.6-50 MG PO TABS
2.0000 | ORAL_TABLET | ORAL | Status: DC
  Administered 2017-06-22: 2 via ORAL
  Filled 2017-06-21: qty 2

## 2017-06-21 MED ORDER — ACETAMINOPHEN 325 MG PO TABS: 650.0000 mg | ORAL_TABLET | ORAL | Status: DC | PRN

## 2017-06-21 MED ORDER — ZOLPIDEM TARTRATE 5 MG PO TABS: 5.0000 mg | ORAL_TABLET | Freq: Every evening | ORAL | Status: DC | PRN

## 2017-06-21 MED ORDER — BENZOCAINE-MENTHOL 20-0.5 % EX AERO
1.0000 "application " | INHALATION_SPRAY | CUTANEOUS | Status: DC | PRN
  Administered 2017-06-21: 1 via TOPICAL
  Filled 2017-06-21: qty 56

## 2017-06-21 MED ORDER — FENTANYL CITRATE (PF) 100 MCG/2ML IJ SOLN: 100.0000 ug | INTRAMUSCULAR | Status: DC | PRN

## 2017-06-21 MED ORDER — LACTATED RINGERS IV SOLN
500.0000 mL | Freq: Once | INTRAVENOUS | Status: AC
  Administered 2017-06-21: 500 mL via INTRAVENOUS

## 2017-06-21 MED ORDER — FENTANYL 2.5 MCG/ML BUPIVACAINE 1/10 % EPIDURAL INFUSION (WH - ANES)
14.0000 mL/h | INTRAMUSCULAR | Status: DC | PRN
  Administered 2017-06-21: 12 mL/h via EPIDURAL
  Filled 2017-06-21: qty 100

## 2017-06-21 MED ORDER — DIPHENHYDRAMINE HCL 50 MG/ML IJ SOLN: 12.5000 mg | INTRAMUSCULAR | Status: DC | PRN

## 2017-06-21 MED ORDER — TERBUTALINE SULFATE 1 MG/ML IJ SOLN: 0.2500 mg | Freq: Once | INTRAMUSCULAR | Status: DC | PRN

## 2017-06-21 MED ORDER — SIMETHICONE 80 MG PO CHEW: 80.0000 mg | CHEWABLE_TABLET | ORAL | Status: DC | PRN

## 2017-06-21 MED ORDER — PHENYLEPHRINE 40 MCG/ML (10ML) SYRINGE FOR IV PUSH (FOR BLOOD PRESSURE SUPPORT)
80.0000 ug | PREFILLED_SYRINGE | INTRAVENOUS | Status: DC | PRN
  Filled 2017-06-21: qty 5

## 2017-06-21 MED ORDER — OXYCODONE-ACETAMINOPHEN 5-325 MG PO TABS: 1.0000 | ORAL_TABLET | ORAL | Status: DC | PRN

## 2017-06-21 MED ORDER — DIBUCAINE 1 % RE OINT: 1.0000 "application " | TOPICAL_OINTMENT | RECTAL | Status: DC | PRN

## 2017-06-21 MED ORDER — LACTATED RINGERS IV SOLN: 500.0000 mL | Freq: Once | INTRAVENOUS | Status: DC

## 2017-06-21 MED ORDER — COCONUT OIL OIL: 1.0000 "application " | TOPICAL_OIL | Status: DC | PRN

## 2017-06-21 MED ORDER — LIDOCAINE HCL (PF) 1 % IJ SOLN
30.0000 mL | INTRAMUSCULAR | Status: DC | PRN
  Administered 2017-06-21: 30 mL via SUBCUTANEOUS
  Filled 2017-06-21 (×2): qty 30

## 2017-06-21 MED ORDER — OXYTOCIN 40 UNITS IN LACTATED RINGERS INFUSION - SIMPLE MED
2.5000 [IU]/h | INTRAVENOUS | Status: DC
  Filled 2017-06-21 (×2): qty 1000

## 2017-06-21 MED ORDER — ONDANSETRON HCL 4 MG/2ML IJ SOLN: 4.0000 mg | Freq: Four times a day (QID) | INTRAMUSCULAR | Status: DC | PRN

## 2017-06-21 MED ORDER — ONDANSETRON HCL 4 MG/2ML IJ SOLN: 4.0000 mg | INTRAMUSCULAR | Status: DC | PRN

## 2017-06-21 MED ORDER — EPHEDRINE 5 MG/ML INJ
10.0000 mg | INTRAVENOUS | Status: DC | PRN
  Filled 2017-06-21: qty 2

## 2017-06-21 MED ORDER — LACTATED RINGERS IV SOLN
INTRAVENOUS | Status: DC
  Administered 2017-06-21 (×2): via INTRAVENOUS

## 2017-06-21 MED ORDER — LACTATED RINGERS IV SOLN: 500.0000 mL | INTRAVENOUS | Status: DC | PRN

## 2017-06-21 MED ORDER — INFLUENZA VAC SPLIT QUAD 0.5 ML IM SUSY
0.5000 mL | PREFILLED_SYRINGE | INTRAMUSCULAR | Status: AC
Start: 2017-06-22 — End: 2017-06-22
  Administered 2017-06-22: 0.5 mL via INTRAMUSCULAR
  Filled 2017-06-21: qty 0.5

## 2017-06-21 MED ORDER — OXYCODONE-ACETAMINOPHEN 5-325 MG PO TABS: 2.0000 | ORAL_TABLET | ORAL | Status: DC | PRN

## 2017-06-21 MED ORDER — OXYTOCIN 40 UNITS IN LACTATED RINGERS INFUSION - SIMPLE MED
1.0000 m[IU]/min | INTRAVENOUS | Status: DC
  Administered 2017-06-21: 2 m[IU]/min via INTRAVENOUS

## 2017-06-21 MED ORDER — LIDOCAINE HCL (PF) 1 % IJ SOLN
INTRAMUSCULAR | Status: DC | PRN
  Administered 2017-06-21: 2 mL via EPIDURAL
  Administered 2017-06-21: 3 mL via EPIDURAL
  Administered 2017-06-21: 5 mL via EPIDURAL

## 2017-06-21 MED ORDER — TETANUS-DIPHTH-ACELL PERTUSSIS 5-2.5-18.5 LF-MCG/0.5 IM SUSP: 0.5000 mL | Freq: Once | INTRAMUSCULAR | Status: DC

## 2017-06-21 MED ORDER — OXYTOCIN BOLUS FROM INFUSION
500.0000 mL | Freq: Once | INTRAVENOUS | Status: AC
  Administered 2017-06-21: 500 mL via INTRAVENOUS

## 2017-06-21 MED ORDER — IBUPROFEN 600 MG PO TABS
600.0000 mg | ORAL_TABLET | Freq: Four times a day (QID) | ORAL | Status: DC
  Administered 2017-06-21 – 2017-06-22 (×4): 600 mg via ORAL
  Filled 2017-06-21 (×4): qty 1

## 2017-06-21 MED ORDER — FLEET ENEMA 7-19 GM/118ML RE ENEM: 1.0000 | ENEMA | RECTAL | Status: DC | PRN

## 2017-06-21 MED ORDER — PHENYLEPHRINE 40 MCG/ML (10ML) SYRINGE FOR IV PUSH (FOR BLOOD PRESSURE SUPPORT)
80.0000 ug | PREFILLED_SYRINGE | INTRAVENOUS | Status: DC | PRN
  Filled 2017-06-21: qty 5
  Filled 2017-06-21: qty 10

## 2017-06-21 MED ORDER — ONDANSETRON HCL 4 MG PO TABS: 4.0000 mg | ORAL_TABLET | ORAL | Status: DC | PRN

## 2017-06-21 MED ORDER — IBUPROFEN 600 MG PO TABS: 600.0000 mg | ORAL_TABLET | Freq: Three times a day (TID) | ORAL | Status: DC

## 2017-06-21 NOTE — Anesthesia Preprocedure Evaluation (Signed)
Anesthesia Evaluation  Patient identified by MRN, date of birth, ID band Patient awake    Reviewed: Allergy & Precautions, NPO status , Patient's Chart, lab work & pertinent test results  Airway Mallampati: II  TM Distance: >3 FB Neck ROM: Full    Dental  (+) Teeth Intact, Dental Advisory Given   Pulmonary neg pulmonary ROS,    Pulmonary exam normal breath sounds clear to auscultation       Cardiovascular negative cardio ROS Normal cardiovascular exam Rhythm:Regular Rate:Normal     Neuro/Psych negative neurological ROS     GI/Hepatic negative GI ROS, Neg liver ROS,   Endo/Other  negative endocrine ROS  Renal/GU negative Renal ROS     Musculoskeletal negative musculoskeletal ROS (+)   Abdominal   Peds  Hematology  (+) Blood dyscrasia, anemia , Plt 303k   Anesthesia Other Findings Day of surgery medications reviewed with the patient.  Reproductive/Obstetrics                             Anesthesia Physical Anesthesia Plan  ASA: II  Anesthesia Plan: Epidural   Post-op Pain Management:    Induction:   PONV Risk Score and Plan: Treatment may vary due to age or medical condition  Airway Management Planned:   Additional Equipment:   Intra-op Plan:   Post-operative Plan:   Informed Consent: I have reviewed the patients History and Physical, chart, labs and discussed the procedure including the risks, benefits and alternatives for the proposed anesthesia with the patient or authorized representative who has indicated his/her understanding and acceptance.   Dental advisory given  Plan Discussed with:   Anesthesia Plan Comments: (Patient identified. Risks/Benefits/Options discussed with patient including but not limited to bleeding, infection, nerve damage, paralysis, failed block, incomplete pain control, headache, blood pressure changes, nausea, vomiting, reactions to medication  both or allergic, itching and postpartum back pain. Confirmed with bedside nurse the patient's most recent platelet count. Confirmed with patient that they are not currently taking any anticoagulation, have any bleeding history or any family history of bleeding disorders. Patient expressed understanding and wished to proceed. All questions were answered.   **Patient [place] speaking. Friend at bedside serving as interpreter.**)        Anesthesia Quick Evaluation

## 2017-06-21 NOTE — Anesthesia Pain Management Evaluation Note (Signed)
  CRNA Pain Management Visit Note  Patient: Kelli Christian, 26 y.o., female  "Hello I am a member of the anesthesia team at [facility]. We have an anesthesia team available at all times to provide care throughout the hospital, including epidural management and anesthesia for C-section. I don't know your plan for the delivery whether it a natural birth, water birth, IV sedation, nitrous supplementation, doula or epidural, but we want to meet your pain goals."   1.Was your pain managed to your expectations on prior hospitalizations?   Yes   2.What is your expectation for pain management during this hospitalization?     Epidural  3.How can we help you reach that goal? epidural  Record the patient's initial score and the patient's pain goal.   Pain: 2  Pain Goal: 5 [facility] wants you to be able to say your pain was always managed very well.  [name] 06/21/2017

## 2017-06-21 NOTE — Progress Notes (Signed)
Discussed vacuum assistance with pt FOB and interpreter.

## 2017-06-21 NOTE — Progress Notes (Signed)
Pt breastfeeding

## 2017-06-21 NOTE — Anesthesia Procedure Notes (Addendum)
Epidural Patient location during procedure: OB Start time: 06/21/2017 6:54 AM End time: 06/21/2017 6:59 AM  Staffing Anesthesiologist: Cecile Hearing Performed: anesthesiologist   Preanesthetic Checklist Completed: patient identified, pre-op evaluation, timeout performed, IV checked, risks and benefits discussed and monitors and equipment checked  Epidural Patient position: sitting Prep: DuraPrep Patient monitoring: blood pressure and continuous pulse ox Approach: midline Location: L3-L4 Injection technique: LOR air  Needle:  Needle type: Tuohy  Needle gauge: 17 G Needle length: 9 cm Needle insertion depth: 4 cm Catheter size: 19 Gauge Catheter at skin depth: 9 cm Test dose: negative and Other (1% Lidocaine)  Additional Notes Patient identified.  Risk benefits discussed including failed block, incomplete pain control, headache, nerve damage, paralysis, blood pressure changes, nausea, vomiting, reactions to medication both toxic or allergic, and postpartum back pain.  Patient expressed understanding and wished to proceed.  All questions were answered.  Sterile technique used throughout procedure and epidural site dressed with sterile barrier dressing. No paresthesia or other complications noted. The patient did not experience any signs of intravascular injection such as tinnitus or metallic taste in mouth nor signs of intrathecal spread such as rapid motor block. Please see nursing notes for vital signs. Reason for block:procedure for pain

## 2017-06-21 NOTE — Progress Notes (Signed)
1 1/2 hour repair completed.  Pt breastfed during repair.  Moving x4 limbs.  Epidural removed with tip intact.

## 2017-06-21 NOTE — H&P (Signed)
LABOR AND DELIVERY ADMISSION HISTORY AND PHYSICAL NOTE  Kelli Christian is a 26 y.o. female G2P1001 with IUP at [redacted]w[redacted]d by [place] presenting for SROM and active labor. She presented to the MAU with contractions and concern for possible ROM. She has been receiving care at HD.  She denies any CP, SOB, headaches with changes in vision or LE edema.   She reports positive fetal movement. She denies leakage of fluid or vaginal bleeding.  Prenatal History/Complications:  Past Medical History: Past Medical History:  Diagnosis Date  . Medical history non-contributory     Past Surgical History: Past Surgical History:  Procedure Laterality Date  . NO PAST SURGERIES      Obstetrical History: OB History    Gravida Para Term Preterm AB Living   SAB TAB Ectopic Multiple Live Births         0 1      Social History: Social History   Social History  . Marital status: Married    Spouse name: N/A  . Number of children: N/A  . Years of education: N/A   Social History Main Topics  . Smoking status: Never Smoker  . Smokeless tobacco: Never Used  . Alcohol use No  . Drug use: No  . Sexual activity: Yes   Other Topics Concern  . None   Social History Narrative  . None    Family History: Family History  Problem Relation Age of Onset  . Alcohol abuse Neg Hx   . Arthritis Neg Hx   . Asthma Neg Hx   . Birth defects Neg Hx   . Cancer Neg Hx   . COPD Neg Hx   . Depression Neg Hx   . Diabetes Neg Hx   . Drug abuse Neg Hx   . Early death Neg Hx   . Hearing loss Neg Hx   . Heart disease Neg Hx   . Hyperlipidemia Neg Hx   . Hypertension Neg Hx   . Kidney disease Neg Hx   . Learning disabilities Neg Hx   . Mental illness Neg Hx   . Mental retardation Neg Hx   . Miscarriages / Stillbirths Neg Hx   . Stroke Neg Hx   . Vision loss Neg Hx   . Varicose Veins Neg Hx     Allergies: No Known Allergies  No prescriptions prior to admission.     Review of Systems   All  systems reviewed and negative except as stated in HPI  Blood pressure 131/90, pulse 94, temperature 98.4 F (36.9 C), temperature source Oral, resp. rate 18, unknown if currently breastfeeding. General appearance: alert Lungs: clear to auscultation bilaterally Heart: regular rate and rhythm Abdomen: soft, non-tender; bowel sounds normal Extremities: No calf swelling or tenderness Presentation: cephalic vertex Fetal monitoring: 135 BPM. Mod variability, pos accels, no decels Uterine activity: contractions q2-13min Dilation: 6 Effacement (%): 90 Station: -1 Exam by:: C Sullivan   Prenatal labs: ABO, Rh: --/--/O POS (10/13 1610) Antibody: NEG (10/13 0538) Rubella:  immune RPR:   NR HBsAg:   Nrg HIV:    NR GBS: Negative (09/21 0000)  1 hr Glucola: 102 Genetic screening:  normal Anatomy US: normal  Prenatal Transfer Tool  Maternal Diabetes: No Genetic Screening: Normal Maternal Ultrasounds/Referrals: Normal Fetal Ultrasounds or other Referrals:  None Maternal Substance Abuse:  No Significant Maternal Medications:  None Significant Maternal Lab Results: None  Results for orders placed or performed  during the hospital encounter of 06/21/17 (from the past 24 hour(s))  CBC   Collection Time: 06/21/17  5:38 AM  Result Value Ref Range   WBC 10.5 4.0 - 10.5 K/uL   RBC 5.33 (H) 3.87 - 5.11 MIL/uL   Hemoglobin 10.3 (L) 12.0 - 15.0 g/dL   HCT 16.1 (L) 09.6 - 04.5 %   MCV 61.5 (L) 78.0 - 100.0 fL   MCH 19.3 (L) 26.0 - 34.0 pg   MCHC 31.4 30.0 - 36.0 g/dL   RDW 40.9 (H) 81.1 - 91.4 %   Platelets 303 150 - 400 K/uL  Type and screen [facility]   Collection Time: 06/21/17  5:38 AM  Result Value Ref Range   ABO/RH(D) O POS    Antibody Screen NEG    Sample Expiration 06/24/2017   Fern Test   Collection Time: 06/21/17  6:12 AM  Result Value Ref Range   POCT Fern Test Positive = ruptured amniotic membanes     Patient Active Problem List   Diagnosis Date  Noted  . Pregnancy 06/21/2017  . Active labor 03/17/2016  . Language barrier affecting health care 03/05/2016  . Choroid plexus cysts, fetal, affecting care of mother, antepartum 11/07/2015  . Abnormal hemoglobin (HCC) 11/04/2015  . Abnormal ultrasonic finding on antenatal screening of mother 11/01/2015  . Supervision of normal first pregnancy in second trimester 10/04/2015  . Late prenatal care affecting pregnancy in second trimester, antepartum 10/04/2015    Assessment: Kelli Christian is a 26 y.o. G2P1001 at [redacted]w[redacted]d here for concern of SROM and active labor. Found to have forebag intact. AROM@0800 . Patient is uncomfortable, but in NAD with epidural. Will continue to reassess and will augment with pitocin if needed.   #Labor: patient in active labor with concern for SROM, forebag ruptured, expect SVD #Pain: epidural #FWB: Cat 1 #ID: GBS neg #MOF: bottle #MOC: pills #Circ:  NA  Renne Musca, MD PGY-2 06/21/2017, 7:04 AM   I confirm that I have verified the information documented in the resident's note and that I have also personally performed the physical exam and all medical decision making activities.  Luna Kitchens CNM

## 2017-06-21 NOTE — Plan of Care (Signed)
Problem: Education: Goal: Knowledge of condition will improve Admission education, safety and unit protocols reviewed with patient, significant other and friend. Patient encouraged to call for assistance to bathroom for the first time. Bottle feeding information sheet given and explained to patient since patient states she plans to bottle feed only. Safe sleep discussed along with scheduled ibuprofen order. All education reviewed with patient using [employer] interpreter "[place] [phone]" using language [name] language unavailable; however, patient stated [place] would work. Patient understood information well.   Problem: Nutritional: Goal: Mothers verbalization of comfort with breastfeeding process will improve Patient bottle feeding only.

## 2017-06-21 NOTE — MAU Note (Signed)
Possible SROM at 0400 clear fluid.  CTX now every 2 min.  No VB.  Reports good FM.

## 2017-06-21 NOTE — Progress Notes (Signed)
POSTPARTUM PROGRESS NOTE  Post Partum Day 1 Subjective:  Kelli Christian is a 26 y.o. G2P1001 [redacted]w[redacted]d s/p SVD.  No acute events overnight.  Pt denies problems with ambulating, voiding or po intake.  She denies nausea or vomiting.  Pain is well controlled. Lochia Small.   Objective: Blood pressure 118/63, pulse 81, temperature 98.5 F (36.9 C), temperature source Oral, resp. rate 18, height  (1.575 m), weight 166 lb (75.3 kg), SpO2 99 %, unknown if currently breastfeeding.  Physical Exam:  General: alert, cooperative and no distress Lochia:normal flow Chest: no respiratory distress Heart:regular rate, distal pulses intact Abdomen: soft, nontender,  Uterine Fundus: firm, appropriately tender DVT Evaluation: No calf swelling or tenderness Extremities:  edema   Recent Labs  06/21/17 0538 06/22/17 0542  HGB 10.3* 8.1*  HCT 32.8* 26.4*    Assessment/Plan:  ASSESSMENT: Kelli Christian is a 26 y.o. G2P1001 [redacted]w[redacted]d s/p SVD.  Plan for discharge tomorrow   LOS: 1 day   Marcine Gadway MossMD 06/21/2017, 10:00 PM

## 2017-06-21 NOTE — Plan of Care (Signed)
Problem: Skin Integrity: Goal: Risk for impaired skin integrity will decrease Patient complained of pain in her left hand that was swollen from a previous IV. Ice and heat packs given for comfort and instructed patient to alternate.

## 2017-06-21 NOTE — Progress Notes (Signed)
Difficult to assist with second stage due to language barrier.  Friend is interpreting.

## 2017-06-22 ENCOUNTER — Encounter (HOSPITAL_COMMUNITY): Payer: Self-pay | Admitting: *Deleted

## 2017-06-22 LAB — CBC
HEMATOCRIT: 26.4 % — AB (ref 36.0–46.0)
HEMOGLOBIN: 8.1 g/dL — AB (ref 12.0–15.0)
MCH: 19.1 pg — AB (ref 26.0–34.0)
MCHC: 30.7 g/dL (ref 30.0–36.0)
MCV: 62.3 fL — AB (ref 78.0–100.0)
Platelets: 254 10*3/uL (ref 150–400)
RBC: 4.24 MIL/uL (ref 3.87–5.11)
RDW: 17.9 % — ABNORMAL HIGH (ref 11.5–15.5)
WBC: 12.9 10*3/uL — AB (ref 4.0–10.5)

## 2017-06-22 MED ORDER — IBUPROFEN 600 MG PO TABS: 600.0000 mg | ORAL_TABLET | Freq: Four times a day (QID) | ORAL | 0 refills | Status: DC

## 2017-06-22 NOTE — Anesthesia Postprocedure Evaluation (Signed)
Anesthesia Post Note  Patient: Ger Deiter  Procedure(s) Performed: AN AD HOC LABOR EPIDURAL     Patient location during evaluation: Mother Baby Anesthesia Type: Epidural Level of consciousness: awake and alert and oriented Pain management: satisfactory to patient Vital Signs Assessment: post-procedure vital signs reviewed and stable Respiratory status: spontaneous breathing and nonlabored ventilation Cardiovascular status: stable Postop Assessment: no headache, no backache, no signs of nausea or vomiting, adequate PO intake and patient able to bend at knees (patient up walking) Anesthetic complications: no    Last Vitals:  Vitals:   06/22/17 0024 06/22/17 0628  BP: 124/78 113/66  Pulse: 70 75  Resp: 18 18  Temp: 36.6 C 36.8 C  SpO2: 100% 100%    Last Pain:  Vitals:   06/22/17 0628  TempSrc: Oral  PainSc: 0-No pain   Pain Goal: Patients Stated Pain Goal: 4 (06/21/17 0802)               Madison Hickman

## 2017-06-22 NOTE — Discharge Summary (Signed)
OB Discharge Summary  Patient Name: Kelli Christian DOB: Feb 21, 1991 MRN: [id number]  Date of admission: 06/21/2017 Delivering MD: Britton Bing   Date of discharge: 06/22/2017  Admitting diagnosis: 39wks ctx water broke Intrauterine pregnancy: [redacted]w[redacted]d     Secondary diagnosis:Active Problems:   Pregnancy  Additional problems:none     Discharge diagnosis: Term Pregnancy Delivered                                                                     Post partum procedures:n/a  Augmentation: Pitocin  Complications: None  Hospital course:  Onset of Labor With Vaginal Delivery     26 y.o. yo G2P1001 at [redacted]w[redacted]d was admitted in Active Labor on 06/21/2017. Patient had an uncomplicated labor course as follows:  Membrane Rupture Time/Date: 8:00 AM ,06/21/2017   Intrapartum Procedures: Episiotomy: Right Mediolateral [4]                                         Lacerations:  2nd degree [3];Perineal [11]  Patient had a delivery of a Viable infant. 06/21/2017  Information for the patient's newborn:  Sarabia, Girl Denitra [[id number]]  Delivery Method: Vaginal, Vacuum (Extractor) (Filed from Delivery Summary)    Pateint had an uncomplicated postpartum course.  She is ambulating, tolerating a regular diet, passing flatus, and urinating well. Patient is discharged home in stable condition on 06/22/17.   Physical exam  Vitals:   06/21/17 1538 06/21/17 1800 06/22/17 0024 06/22/17 0628  BP: 110/67 118/63 124/78 113/66  Pulse: 91 81 70 75  Resp:  Temp:  98.5 F (36.9 C) 97.8 F (36.6 C) 98.2 F (36.8 C)  TempSrc:  Oral Oral Oral  SpO2:  99% 100% 100%  Weight:      Height:       General: alert, cooperative and no distress Lochia: appropriate Uterine Fundus: firm Incision: N/A DVT Evaluation: No evidence of DVT seen on physical exam. Labs: Lab Results  Component Value Date   WBC 12.9 (H) 06/22/2017   HGB 8.1 (L) 06/22/2017   HCT 26.4 (L) 06/22/2017   MCV 62.3 (L)  06/22/2017   PLT 254 06/22/2017   No flowsheet data found.  Discharge instruction: per After Visit Summary and "Baby and Me Booklet".  After Visit Meds:  Allergies as of 06/22/2017   No Known Allergies     Medication List    TAKE these medications   ibuprofen 600 MG tablet Commonly known as:  ADVIL,MOTRIN Take 1 tablet (600 mg total) by mouth every 6 (six) hours.   VIRT-C DHA 53.5-38-1 MG Caps Take 1 capsule by mouth daily.       Diet: routine diet  Activity: Advance as tolerated. Pelvic rest for 6 weeks.   Outpatient follow up:6 weeks Follow up Appt:No future appointments. Follow up visit: No Follow-up on file.  Postpartum contraception: Undecided  Newborn Data: Live born female  Birth Weight: 5 lb 10 oz (2550 g) APGAR: 9, 9  Newborn Delivery   Birth date/time:  06/21/2017 11:55:00 Delivery type:  Vaginal, Vacuum (Extractor)      Baby Feeding: Breast Disposition:home with  mother   06/22/2017 Wyvonnia Dusky, CNM

## 2017-06-22 NOTE — Discharge Instructions (Signed)

## 2017-06-22 NOTE — Progress Notes (Signed)
Discharge teaching completed with video interpreter Kenney Houseman [phone].  All questions were answered.

## 2017-06-22 NOTE — Progress Notes (Signed)
Edinburgh depression score completed and patient educated on the plan of care for the day and the potential for early discharge, per her request.  [place] interpreter Tram [phone] used.

## 2017-07-22 ENCOUNTER — Ambulatory Visit: Payer: Medicaid Other | Admitting: Obstetrics and Gynecology

## 2017-07-22 ENCOUNTER — Encounter: Payer: Self-pay | Admitting: General Practice

## 2017-07-22 ENCOUNTER — Telehealth: Payer: Self-pay | Admitting: General Practice

## 2017-07-22 NOTE — Telephone Encounter (Signed)
Patient was a NO Show for today's appointment.  Tried calling cell and home numbers with interpreter.  Unable to leave message for patient.

## 2017-08-19 ENCOUNTER — Ambulatory Visit: Payer: Medicaid Other | Admitting: Advanced Practice Midwife

## 2017-08-28 ENCOUNTER — Ambulatory Visit (INDEPENDENT_AMBULATORY_CARE_PROVIDER_SITE_OTHER): Payer: Medicaid Other | Admitting: Advanced Practice Midwife

## 2017-08-28 ENCOUNTER — Encounter: Payer: Self-pay | Admitting: Advanced Practice Midwife

## 2017-08-28 VITALS — BP 121/84 | HR 91 | Ht 60.0 in | Wt 139.8 lb

## 2017-08-28 DIAGNOSIS — Z3202 Encounter for pregnancy test, result negative: Secondary | ICD-10-CM | POA: Diagnosis not present

## 2017-08-28 DIAGNOSIS — Z30011 Encounter for initial prescription of contraceptive pills: Secondary | ICD-10-CM | POA: Diagnosis present

## 2017-08-28 DIAGNOSIS — K649 Unspecified hemorrhoids: Secondary | ICD-10-CM

## 2017-08-28 DIAGNOSIS — Z1389 Encounter for screening for other disorder: Secondary | ICD-10-CM

## 2017-08-28 LAB — POCT PREGNANCY, URINE: Preg Test, Ur: NEGATIVE

## 2017-08-28 MED ORDER — NORETHINDRONE 0.35 MG PO TABS: 1.0000 | ORAL_TABLET | Freq: Every day | ORAL | 12 refills | Status: DC

## 2017-08-28 MED ORDER — PHENYLEPH-SHARK LIV OIL-MO-PET 0.25-3-14-71.9 % RE OINT
1.0000 | TOPICAL_OINTMENT | Freq: Two times a day (BID) | RECTAL | 1 refills | Status: DC | PRN
Start: 2017-08-28 — End: 2019-06-11

## 2017-08-28 NOTE — Progress Notes (Signed)
Subjective:     Kelli Christian is a 26 y.o. female who presents for a postpartum visit. She is 5 weeks postpartum following a spontaneous vaginal delivery. I have fully reviewed the prenatal and intrapartum course. The delivery was at 39 gestational weeks. Outcome: spontaneous vaginal delivery. Anesthesia: epidural. Postpartum course has been Uncomplicated. Baby's course has been Uncomplicated. Baby is feeding by breast. Bleeding no bleeding. Bowel function is normal. C/O lumps  Near her vagina Bladder function is normal. Patient is not sexually active. Contraception method is planning oral progesterone-only contraceptive. Postpartum depression screening: negative.  The following portions of the patient's history were reviewed and updated as appropriate: allergies, current medications, past family history, past medical history, past social history, past surgical history and problem list.  Review of Systems Pertinent items are noted in HPI.   Objective:    BP 121/84   Pulse 91   Ht 5' (1.524 m)   Wt 139 lb 12.8 oz (63.4 kg)   LMP 07/25/2017   Breastfeeding? Yes   BMI 27.30 kg/m   General:  alert, cooperative and no distress   Breasts:  inspection negative, no nipple discharge or bleeding, no masses or nodularity palpable  Lungs: clear to auscultation bilaterally  Heart:  regular rate and rhythm, S1, S2 normal, no murmur, click, rub or gallop  Abdomen: soft, non-tender; bowel sounds normal; no masses,  no organomegaly   Vulva:  normal and healing well  Vagina: normal vagina           Rectal Exam: external hemorrhoids, small        Assessment:     Nml postpartum exam. Pap smear not done at today's visit.   Small hemorrhoids  Plan:    1. Contraception: oral progesterone-only contraceptive 2. Pap due 2021 3. Follow up in: 1 year or as needed.

## 2017-08-28 NOTE — Patient Instructions (Signed)
Preparation H for Hemorrhoids   B?nh tr? Hemorrhoids B?nh tr? l cc t?nh m?ch ? trong ho?c xung quanh tr?c trng ho?c h?u mn b? s?ng ln. C hai lo?i b?nh tr?:  B?nh tr? n?i. B?nh x?y ra v?i nh?ng t?nh m?ch ? ngay bn trong tr?c trng. Cc m?ch mu c th? tr?i ra ngoi v b? kch ?ng v ?au ??n.  B?nh tr? ngo?i. B?nh x?y ra ? nh?ng t?nh m?ch bn ngoi h?u mn v c th? s? th?y nh? m?t c?c s?ng ?au ho?c c?ng g?n h?u mn.  H?u h?t cc bi tr? khng gy v?n ?? nghim tr?ng v c th? ???c x? tr b?ng cch ?i?u tr? t?i nh nh? thay ??i ch? ?? ?n v thay ??i l?i s?ng. N?u ?i?u tr? t?i nh khng lm gi?m tri?u ch?ng, c th? c?n th?c hi?n cc th? thu?t lm co nh? ho?c c?t bi tr?[place]. Nguyn nhn g gy ra? B?nh ny do p l?c t?ng ln ? khu v?c h?u mn gy ra. p l?c ny c th? do nhi?u nguyn nhn khc nhau, bao g?m:  To bn.  Ph?i r?n m?nh ?? ??i ti?n.  Tiu ch?y.  [place].  Bo ph.  Ng?i trong th?i gian di.  Nng v?t n?ng ho?c ho?t ??ng khc lm qu v? g?ng s?c.  Quan h? tnh d?c qua h?u mn.  Cc d?u hi?u ho?c tri?u ch?ng l g? Nh?ng tri?u ch?ng c?a tnh tr?ng ny bao g?m:  ?au.  Ng?a ho?c kch ?ng h?u mn.  Ch?y mu tr?c trng.  R r? phn (phn).  S?ng h?u mn.  M?t ho?c nhi?u u c?c xung quanh h?u mn.  Ch?n ?on tnh tr?ng ny nh? th? no? Tnh tr?ng ny th??ng c th? ???c ch?n ?on b?ng cch ki?m tra qua tr?c quan. Cc ki?m tra ho?c xt nghi?m khc c?ng c th? ???c th?c hi?n, ch?ng h?n nh?:  Khm vng tr?c trng b?ng tay ?eo g?ng (Khm tr?c trng b?ng ngn tay).  Khm h?u mn b?ng cch s? d?ng m?t ?ng nh? (?ng soi h?u mn).  Xt nghi?m mu, n?u qu v? b? m?t m?t l??ng mu ?ng k?.  Ki?m tra xem bn trong ??i trng (n?i soi ??i trng sigma ho?c n?i soi ??i trng).  Tnh tr?ng ny ???c ?i?u tr? nh? th? no? Tnh tr?ng ny th??ng c th? ?i?u tr? t?i nh. Tuy nhin, c th? c?n lm nhi?u th? thu?t khc nhau n?u vi?c thay ??i ch? ?? ?n u?ng, thay ??i l?i s?ng  v cc bi?n php ?i?u tr? khc t?i nh khng lm gi?m tri?u ch?ng. Nh?ng th? thu?t ny c th? gip lm cho cc bi tr? nh? h?n ho?c c?t b? chng hon ton. M?t s? th? thu?t lin quan ??n ph?u thu?t v m?t s? khc th khng. Cc th? thu?t ph? bi?n bao g?m:  Th?t tr? b?ng vng cao su. Cc vng cao su ???c ??t ? g?c cc bi tr? ?? ng?t dng mu cung c?p ??n bi tr?Marland Kitchen.  Li?u php x? ha. Thu?c ???c tim vo bi tr? ?? lm chng co nh? l?i.  Lm ?ng b?ng tia h?ng ngo?i. M?t lo?i n?ng l??ng nh? ???c s? d?ng ?? lo?i b? tr?.  Ph?u thu?t c?t tr?. Bi tr? ???c c?t b? b?ng ph?u thu?t v cc t?nh m?ch cung c?p mu ??n cc bi tr? ny ???c th?t l?i.  Ph?u thu?t c?t tr? b?ng k?p. M?t d?ng c? k?p hnh trn ???c dng ?? c?t b? cc bi tr? v  dng ghim khu ph?u thu?t ng?n ngu?n cung c?p mu ??n cc bi tr?Athena Masse th? nh?ng h??ng d?n ny ? nh: ?n v u?ng  ?n th?c ?n c nhi?u ch?t x? nh? ng? c?c nguyn h?t, cc lo?i ??u, h?t, tri cy v rau. Hy h?i chuyn gia ch?m [place] s?c kh?e v? vi?c dng cc s?n ph?m c thm ch?t x? (th?c ph?m b? sung ch?t x?).  U?ng ?? n??c ?? gi? cho n??c ti?u trong ho?c c mu vng nh?t. X? tr ?au v s?ng n?  T?m ng?i b?ng n??c ?m trong 20 pht, 3-4 l?n m?i ngy ?? gi?m ?au v gi?m c?m gic kh ch?u.  N?u ???c ch? d?n, ch??m ? l?nh vo vng b? ?nh h??ng. S? d?ng cc ti ch??m l?nh gi?a nh?ng l?n t?m ng?i c th? c tc d?ng. ? Cho ? l?nh vo ti ni lng. ? ?? kh?n t?m ? gi?a da v ti ch??m. ? Ch??m ? l?nh trong 20 pht, 2-3 l?n m?i ngy. H??ng d?n chung  Ch? s? d?ng thu?c khng k ??n v thu?c k ??n theo ch? d?n c?a chuyn gia ch?m [place] s?c kh?e.  S? d?ng thu?c d?ng kem ho?c vin ??n theo ch? d?n.  T?p th? d?c th??ng xuyn.  Vo nh v? sinh khi qu v? bu?n ?i ??i ti?n. Khng ch? ??i.  Trnh r?n m?nh khi ?i ??i ti?n.  Gi? cho vng h?u mn kh v s?ch. S? d?ng gi?y v? sinh ??t ho?c kh?n gi?y v? sinh ?m sau khi ?i ??i ti?n.  Khng ng?i lu trong nh v? sinh. Vi?c ny  lm t?ng ? mu v ?au. Hy lin l?c v?i chuyn gia ch?m [place] s?c kh?e n?u:  Qu v? b? ?au v s?ng t?ng ln m khng ki?m sot ???c cch ?i?u tr? ho?c b?ng thu?c.  Qu v? b? ch?y mu khng ki?m sot ???c.  Qu v? ?i ??i ti?n kh kh?n, ho?c qu v? khng th? ??i ti?n ???c.  Qu v? b? ?au ho?c vim ? ngoi khu v?c c?a cc bi tr?Tera Mater tin ny khng nh?m m?c ?ch thay th? cho l?i khuyn m chuyn gia ch?m [place] s?c kh?e ni v?i qu v?. Hy b?o ??m qu v? ph?i th?o lu?n b?t k? v?n ?? g m qu v? c v?i chuyn gia ch?m [place] s?c kh?e c?a qu v?. Document Released: 06/05/2005 Document Revised: 12/11/2016 Document Reviewed: 05/10/2015 Elsevier Interactive Patient Education  2018 [employer].

## 2017-09-05 ENCOUNTER — Ambulatory Visit: Payer: Medicaid Other | Admitting: Certified Nurse Midwife

## 2017-12-08 ENCOUNTER — Encounter: Payer: Self-pay | Admitting: *Deleted

## 2018-11-18 DIAGNOSIS — Z3200 Encounter for pregnancy test, result unknown: Secondary | ICD-10-CM | POA: Diagnosis not present

## 2019-03-04 DIAGNOSIS — Z3482 Encounter for supervision of other normal pregnancy, second trimester: Secondary | ICD-10-CM | POA: Diagnosis not present

## 2019-03-04 DIAGNOSIS — Z0389 Encounter for observation for other suspected diseases and conditions ruled out: Secondary | ICD-10-CM | POA: Diagnosis not present

## 2019-03-04 DIAGNOSIS — Z3009 Encounter for other general counseling and advice on contraception: Secondary | ICD-10-CM | POA: Diagnosis not present

## 2019-03-04 DIAGNOSIS — D582 Other hemoglobinopathies: Secondary | ICD-10-CM | POA: Diagnosis not present

## 2019-03-04 DIAGNOSIS — Z1388 Encounter for screening for disorder due to exposure to contaminants: Secondary | ICD-10-CM | POA: Diagnosis not present

## 2019-03-04 DIAGNOSIS — Z789 Other specified health status: Secondary | ICD-10-CM | POA: Diagnosis not present

## 2019-03-11 ENCOUNTER — Other Ambulatory Visit (HOSPITAL_COMMUNITY): Payer: Self-pay | Admitting: Obstetrics & Gynecology

## 2019-03-11 DIAGNOSIS — Z3A28 28 weeks gestation of pregnancy: Secondary | ICD-10-CM

## 2019-03-11 DIAGNOSIS — Z3689 Encounter for other specified antenatal screening: Secondary | ICD-10-CM

## 2019-03-11 DIAGNOSIS — Z364 Encounter for antenatal screening for fetal growth retardation: Secondary | ICD-10-CM

## 2019-03-15 DIAGNOSIS — D649 Anemia, unspecified: Secondary | ICD-10-CM | POA: Diagnosis not present

## 2019-03-15 DIAGNOSIS — O99013 Anemia complicating pregnancy, third trimester: Secondary | ICD-10-CM | POA: Diagnosis not present

## 2019-03-15 DIAGNOSIS — Z789 Other specified health status: Secondary | ICD-10-CM | POA: Diagnosis not present

## 2019-03-15 DIAGNOSIS — Z23 Encounter for immunization: Secondary | ICD-10-CM | POA: Diagnosis not present

## 2019-03-15 DIAGNOSIS — O0930 Supervision of pregnancy with insufficient antenatal care, unspecified trimester: Secondary | ICD-10-CM | POA: Diagnosis not present

## 2019-03-15 DIAGNOSIS — D582 Other hemoglobinopathies: Secondary | ICD-10-CM | POA: Diagnosis not present

## 2019-03-15 DIAGNOSIS — Z3482 Encounter for supervision of other normal pregnancy, second trimester: Secondary | ICD-10-CM | POA: Diagnosis not present

## 2019-03-20 ENCOUNTER — Encounter (HOSPITAL_COMMUNITY): Payer: Self-pay | Admitting: *Deleted

## 2019-03-23 ENCOUNTER — Encounter (HOSPITAL_COMMUNITY): Payer: Self-pay

## 2019-03-23 ENCOUNTER — Ambulatory Visit (HOSPITAL_COMMUNITY): Payer: Medicaid Other | Admitting: *Deleted

## 2019-03-23 ENCOUNTER — Other Ambulatory Visit (HOSPITAL_COMMUNITY): Payer: Self-pay | Admitting: *Deleted

## 2019-03-23 ENCOUNTER — Other Ambulatory Visit: Payer: Self-pay

## 2019-03-23 ENCOUNTER — Ambulatory Visit (HOSPITAL_COMMUNITY)
Admission: RE | Admit: 2019-03-23 | Discharge: 2019-03-23 | Disposition: A | Discharge status: Home / Self Care | Payer: Medicaid Other | Source: Ambulatory Visit | Attending: Obstetrics and Gynecology | Admitting: Obstetrics and Gynecology

## 2019-03-23 VITALS — BP 105/64 | HR 89 | Temp 98.6°F

## 2019-03-23 DIAGNOSIS — Z3A28 28 weeks gestation of pregnancy: Secondary | ICD-10-CM | POA: Diagnosis not present

## 2019-03-23 DIAGNOSIS — O0990 Supervision of high risk pregnancy, unspecified, unspecified trimester: Secondary | ICD-10-CM

## 2019-03-23 DIAGNOSIS — Z3689 Encounter for other specified antenatal screening: Secondary | ICD-10-CM | POA: Diagnosis not present

## 2019-03-23 DIAGNOSIS — O359XX0 Maternal care for (suspected) fetal abnormality and damage, unspecified, not applicable or unspecified: Secondary | ICD-10-CM | POA: Diagnosis not present

## 2019-03-23 DIAGNOSIS — Z362 Encounter for other antenatal screening follow-up: Secondary | ICD-10-CM

## 2019-03-23 DIAGNOSIS — Z364 Encounter for antenatal screening for fetal growth retardation: Secondary | ICD-10-CM | POA: Insufficient documentation

## 2019-03-23 HISTORY — DX: Unspecified abnormal cytological findings in specimens from vagina: R87.629

## 2019-03-23 NOTE — Progress Notes (Signed)
Interpreter used during visit.

## 2019-03-26 DIAGNOSIS — Z23 Encounter for immunization: Secondary | ICD-10-CM | POA: Diagnosis not present

## 2019-03-26 DIAGNOSIS — D649 Anemia, unspecified: Secondary | ICD-10-CM | POA: Diagnosis not present

## 2019-03-26 DIAGNOSIS — D582 Other hemoglobinopathies: Secondary | ICD-10-CM | POA: Diagnosis not present

## 2019-03-26 DIAGNOSIS — Z3482 Encounter for supervision of other normal pregnancy, second trimester: Secondary | ICD-10-CM | POA: Diagnosis not present

## 2019-03-26 DIAGNOSIS — O0930 Supervision of pregnancy with insufficient antenatal care, unspecified trimester: Secondary | ICD-10-CM | POA: Diagnosis not present

## 2019-03-26 DIAGNOSIS — Z789 Other specified health status: Secondary | ICD-10-CM | POA: Diagnosis not present

## 2019-03-26 DIAGNOSIS — O99013 Anemia complicating pregnancy, third trimester: Secondary | ICD-10-CM | POA: Diagnosis not present

## 2019-03-30 DIAGNOSIS — D649 Anemia, unspecified: Secondary | ICD-10-CM | POA: Diagnosis not present

## 2019-03-30 DIAGNOSIS — Z3483 Encounter for supervision of other normal pregnancy, third trimester: Secondary | ICD-10-CM | POA: Diagnosis not present

## 2019-03-30 DIAGNOSIS — O0930 Supervision of pregnancy with insufficient antenatal care, unspecified trimester: Secondary | ICD-10-CM | POA: Diagnosis not present

## 2019-03-30 DIAGNOSIS — O99013 Anemia complicating pregnancy, third trimester: Secondary | ICD-10-CM | POA: Diagnosis not present

## 2019-03-30 DIAGNOSIS — Z789 Other specified health status: Secondary | ICD-10-CM | POA: Diagnosis not present

## 2019-03-30 DIAGNOSIS — D582 Other hemoglobinopathies: Secondary | ICD-10-CM | POA: Diagnosis not present

## 2019-03-30 DIAGNOSIS — Z23 Encounter for immunization: Secondary | ICD-10-CM | POA: Diagnosis not present

## 2019-04-20 ENCOUNTER — Other Ambulatory Visit: Payer: Self-pay

## 2019-04-20 ENCOUNTER — Ambulatory Visit (HOSPITAL_COMMUNITY)
Admission: RE | Admit: 2019-04-20 | Discharge: 2019-04-20 | Disposition: A | Discharge status: Home / Self Care | Payer: Medicaid Other | Source: Ambulatory Visit | Attending: Obstetrics and Gynecology | Admitting: Obstetrics and Gynecology

## 2019-04-20 DIAGNOSIS — Z362 Encounter for other antenatal screening follow-up: Secondary | ICD-10-CM

## 2019-04-20 DIAGNOSIS — Z3A32 32 weeks gestation of pregnancy: Secondary | ICD-10-CM

## 2019-05-06 ENCOUNTER — Other Ambulatory Visit: Payer: Self-pay

## 2019-05-06 DIAGNOSIS — Z20822 Contact with and (suspected) exposure to covid-19: Secondary | ICD-10-CM

## 2019-05-06 DIAGNOSIS — R6889 Other general symptoms and signs: Secondary | ICD-10-CM | POA: Diagnosis not present

## 2019-05-07 DIAGNOSIS — Z3483 Encounter for supervision of other normal pregnancy, third trimester: Secondary | ICD-10-CM | POA: Diagnosis not present

## 2019-05-08 LAB — NOVEL CORONAVIRUS, NAA: SARS-CoV-2, NAA: NOT DETECTED

## 2019-05-18 DIAGNOSIS — Z3483 Encounter for supervision of other normal pregnancy, third trimester: Secondary | ICD-10-CM | POA: Diagnosis not present

## 2019-05-18 DIAGNOSIS — O99013 Anemia complicating pregnancy, third trimester: Secondary | ICD-10-CM | POA: Diagnosis not present

## 2019-05-18 DIAGNOSIS — O0930 Supervision of pregnancy with insufficient antenatal care, unspecified trimester: Secondary | ICD-10-CM | POA: Diagnosis not present

## 2019-05-18 DIAGNOSIS — Z23 Encounter for immunization: Secondary | ICD-10-CM | POA: Diagnosis not present

## 2019-05-18 DIAGNOSIS — Z3482 Encounter for supervision of other normal pregnancy, second trimester: Secondary | ICD-10-CM | POA: Diagnosis not present

## 2019-05-18 DIAGNOSIS — D582 Other hemoglobinopathies: Secondary | ICD-10-CM | POA: Diagnosis not present

## 2019-05-18 DIAGNOSIS — D649 Anemia, unspecified: Secondary | ICD-10-CM | POA: Diagnosis not present

## 2019-05-18 DIAGNOSIS — Z789 Other specified health status: Secondary | ICD-10-CM | POA: Diagnosis not present

## 2019-06-03 DIAGNOSIS — Z3483 Encounter for supervision of other normal pregnancy, third trimester: Secondary | ICD-10-CM | POA: Diagnosis not present

## 2019-06-09 ENCOUNTER — Inpatient Hospital Stay (HOSPITAL_COMMUNITY): Payer: Medicaid Other | Admitting: Anesthesiology

## 2019-06-09 ENCOUNTER — Encounter (HOSPITAL_COMMUNITY): Payer: Self-pay

## 2019-06-09 ENCOUNTER — Encounter (HOSPITAL_COMMUNITY)
Admission: AD | Disposition: A | Discharge status: Home / Self Care | Payer: Self-pay | Source: Home / Self Care | Attending: Obstetrics and Gynecology

## 2019-06-09 ENCOUNTER — Other Ambulatory Visit: Payer: Self-pay

## 2019-06-09 ENCOUNTER — Inpatient Hospital Stay (HOSPITAL_COMMUNITY)
Admission: AD | Admit: 2019-06-09 | Discharge: 2019-06-11 | DRG: 788 | Disposition: A | Discharge status: Home / Self Care | Payer: Medicaid Other | Attending: Obstetrics and Gynecology | Admitting: Obstetrics and Gynecology

## 2019-06-09 DIAGNOSIS — D649 Anemia, unspecified: Secondary | ICD-10-CM | POA: Diagnosis present

## 2019-06-09 DIAGNOSIS — Z23 Encounter for immunization: Secondary | ICD-10-CM

## 2019-06-09 DIAGNOSIS — O9902 Anemia complicating childbirth: Secondary | ICD-10-CM | POA: Diagnosis present

## 2019-06-09 DIAGNOSIS — Z20828 Contact with and (suspected) exposure to other viral communicable diseases: Secondary | ICD-10-CM | POA: Diagnosis present

## 2019-06-09 DIAGNOSIS — Z98891 History of uterine scar from previous surgery: Secondary | ICD-10-CM

## 2019-06-09 DIAGNOSIS — Z3A39 39 weeks gestation of pregnancy: Secondary | ICD-10-CM

## 2019-06-09 DIAGNOSIS — O321XX0 Maternal care for breech presentation, not applicable or unspecified: Secondary | ICD-10-CM | POA: Diagnosis not present

## 2019-06-09 DIAGNOSIS — S31119A Laceration without foreign body of abdominal wall, unspecified quadrant without penetration into peritoneal cavity, initial encounter: Secondary | ICD-10-CM | POA: Diagnosis not present

## 2019-06-09 DIAGNOSIS — Z789 Other specified health status: Secondary | ICD-10-CM | POA: Diagnosis present

## 2019-06-09 DIAGNOSIS — O99892 Other specified diseases and conditions complicating childbirth: Secondary | ICD-10-CM | POA: Diagnosis present

## 2019-06-09 DIAGNOSIS — Z758 Other problems related to medical facilities and other health care: Secondary | ICD-10-CM | POA: Diagnosis present

## 2019-06-09 DIAGNOSIS — D582 Other hemoglobinopathies: Secondary | ICD-10-CM | POA: Diagnosis present

## 2019-06-09 LAB — CREATININE, SERUM
Creatinine, Ser: 0.39 mg/dL — ABNORMAL LOW (ref 0.44–1.00)
GFR calc Af Amer: >60 mL/min
GFR calc non Af Amer: >60 mL/min

## 2019-06-09 LAB — CBC
HCT: 33.7 % — ABNORMAL LOW (ref 36.0–46.0)
HCT: 25 % — ABNORMAL LOW (ref 36.0–46.0)
Hemoglobin: 10.5 g/dL — ABNORMAL LOW (ref 12.0–15.0)
Hemoglobin: 8.1 g/dL — ABNORMAL LOW (ref 12.0–15.0)
MCH: 21.6 pg — ABNORMAL LOW (ref 26.0–34.0)
MCH: 22.1 pg — ABNORMAL LOW (ref 26.0–34.0)
MCHC: 31.2 g/dL (ref 30.0–36.0)
MCHC: 32.4 g/dL (ref 30.0–36.0)
MCV: 69.2 fL — ABNORMAL LOW (ref 80.0–100.0)
MCV: 68.3 fL — ABNORMAL LOW (ref 80.0–100.0)
Platelets: 251 10*3/uL (ref 150–400)
Platelets: 258 10*3/uL (ref 150–400)
RBC: 4.87 MIL/uL (ref 3.87–5.11)
RBC: 3.66 MIL/uL — ABNORMAL LOW (ref 3.87–5.11)
RDW: 16.4 % — ABNORMAL HIGH (ref 11.5–15.5)
RDW: 16.1 % — ABNORMAL HIGH (ref 11.5–15.5)
WBC: 12 10*3/uL — ABNORMAL HIGH (ref 4.0–10.5)
WBC: 16.8 10*3/uL — ABNORMAL HIGH (ref 4.0–10.5)
nRBC: 0 % (ref 0.0–0.2)
nRBC: 0 % (ref 0.0–0.2)

## 2019-06-09 LAB — ABO/RH: ABO/RH(D): O POS

## 2019-06-09 LAB — SARS CORONAVIRUS 2 BY RT PCR (HOSPITAL ORDER, PERFORMED IN ~~LOC~~ HOSPITAL LAB): SARS Coronavirus 2: NEGATIVE

## 2019-06-09 SURGERY — Surgical Case

## 2019-06-09 SURGERY — Surgical Case
Anesthesia: Epidural

## 2019-06-09 MED ORDER — ONDANSETRON HCL 4 MG/2ML IJ SOLN
INTRAMUSCULAR | Status: AC
  Filled 2019-06-09: qty 2

## 2019-06-09 MED ORDER — IBUPROFEN 800 MG PO TABS
800.0000 mg | ORAL_TABLET | Freq: Four times a day (QID) | ORAL | Status: DC
  Administered 2019-06-10 – 2019-06-11 (×3): 800 mg via ORAL
  Filled 2019-06-09 (×3): qty 1

## 2019-06-09 MED ORDER — ENOXAPARIN SODIUM 40 MG/0.4ML ~~LOC~~ SOLN
40.0000 mg | SUBCUTANEOUS | Status: DC
  Administered 2019-06-11: 14:00:00 40 mg via SUBCUTANEOUS
  Filled 2019-06-09 (×2): qty 0.4

## 2019-06-09 MED ORDER — SIMETHICONE 80 MG PO CHEW
80.0000 mg | CHEWABLE_TABLET | Freq: Three times a day (TID) | ORAL | Status: DC
  Administered 2019-06-10 – 2019-06-11 (×3): 80 mg via ORAL
  Filled 2019-06-09 (×4): qty 1

## 2019-06-09 MED ORDER — PRENATAL MULTIVITAMIN CH
1.0000 | ORAL_TABLET | Freq: Every day | ORAL | Status: DC
  Administered 2019-06-10 – 2019-06-11 (×2): 1 via ORAL
  Filled 2019-06-09 (×2): qty 1

## 2019-06-09 MED ORDER — SODIUM CHLORIDE (PF) 0.9 % IJ SOLN
INTRAMUSCULAR | Status: AC
  Filled 2019-06-09: qty 10

## 2019-06-09 MED ORDER — EPHEDRINE 5 MG/ML INJ: 10.0000 mg | INTRAVENOUS | Status: DC | PRN

## 2019-06-09 MED ORDER — KETOROLAC TROMETHAMINE 30 MG/ML IJ SOLN
INTRAMUSCULAR | Status: DC | PRN
  Administered 2019-06-09: 30 mg via INTRAVENOUS

## 2019-06-09 MED ORDER — SIMETHICONE 80 MG PO CHEW
80.0000 mg | CHEWABLE_TABLET | ORAL | Status: DC
  Administered 2019-06-10 (×2): 80 mg via ORAL
  Filled 2019-06-09 (×2): qty 1

## 2019-06-09 MED ORDER — CEFAZOLIN SODIUM-DEXTROSE 2-4 GM/100ML-% IV SOLN
INTRAVENOUS | Status: AC
  Filled 2019-06-09: qty 100

## 2019-06-09 MED ORDER — MEPERIDINE HCL 25 MG/ML IJ SOLN: 6.2500 mg | INTRAMUSCULAR | Status: DC | PRN

## 2019-06-09 MED ORDER — OXYTOCIN 40 UNITS IN NORMAL SALINE INFUSION - SIMPLE MED: 2.5000 [IU]/h | INTRAVENOUS | Status: DC

## 2019-06-09 MED ORDER — LACTATED RINGERS IV SOLN: 500.0000 mL | Freq: Once | INTRAVENOUS | Status: DC

## 2019-06-09 MED ORDER — SENNOSIDES-DOCUSATE SODIUM 8.6-50 MG PO TABS
2.0000 | ORAL_TABLET | ORAL | Status: DC
  Administered 2019-06-10 (×2): 2 via ORAL
  Filled 2019-06-09 (×2): qty 2

## 2019-06-09 MED ORDER — PHENYLEPHRINE 40 MCG/ML (10ML) SYRINGE FOR IV PUSH (FOR BLOOD PRESSURE SUPPORT): 80.0000 ug | PREFILLED_SYRINGE | INTRAVENOUS | Status: DC | PRN

## 2019-06-09 MED ORDER — LACTATED RINGERS IV SOLN
INTRAVENOUS | Status: DC
  Administered 2019-06-10: 125 mL/h via INTRAVENOUS
  Administered 2019-06-10: 05:00:00 via INTRAVENOUS

## 2019-06-09 MED ORDER — PROMETHAZINE HCL 25 MG/ML IJ SOLN: 6.2500 mg | INTRAMUSCULAR | Status: DC | PRN

## 2019-06-09 MED ORDER — LACTATED RINGERS IV SOLN: 500.0000 mL | INTRAVENOUS | Status: DC | PRN

## 2019-06-09 MED ORDER — KETOROLAC TROMETHAMINE 30 MG/ML IJ SOLN
INTRAMUSCULAR | Status: AC
  Filled 2019-06-09: qty 1

## 2019-06-09 MED ORDER — SODIUM CHLORIDE 0.9 % IV SOLN
INTRAVENOUS | Status: DC | PRN
  Administered 2019-06-09: 40 [IU] via INTRAVENOUS

## 2019-06-09 MED ORDER — SODIUM CHLORIDE 0.9 % IR SOLN
Status: DC | PRN
  Administered 2019-06-09: 1 via INTRAVESICAL

## 2019-06-09 MED ORDER — ACETAMINOPHEN 325 MG PO TABS: 650.0000 mg | ORAL_TABLET | ORAL | Status: DC | PRN

## 2019-06-09 MED ORDER — SODIUM CHLORIDE 0.9% FLUSH: 3.0000 mL | INTRAVENOUS | Status: DC | PRN

## 2019-06-09 MED ORDER — MENTHOL 3 MG MT LOZG: 1.0000 | LOZENGE | OROMUCOSAL | Status: DC | PRN

## 2019-06-09 MED ORDER — DIPHENHYDRAMINE HCL 50 MG/ML IJ SOLN: 12.5000 mg | INTRAMUSCULAR | Status: DC | PRN

## 2019-06-09 MED ORDER — SODIUM CHLORIDE 0.9 % IV SOLN
INTRAVENOUS | Status: DC | PRN
  Administered 2019-06-09: 20:00:00 via INTRAVENOUS

## 2019-06-09 MED ORDER — MORPHINE SULFATE (PF) 0.5 MG/ML IJ SOLN
INTRAMUSCULAR | Status: DC | PRN
  Administered 2019-06-09: 3 mg via EPIDURAL

## 2019-06-09 MED ORDER — NALBUPHINE HCL 10 MG/ML IJ SOLN
5.0000 mg | Freq: Once | INTRAMUSCULAR | Status: DC | PRN
  Filled 2019-06-09: qty 0.5

## 2019-06-09 MED ORDER — SOD CITRATE-CITRIC ACID 500-334 MG/5ML PO SOLN
30.0000 mL | ORAL | Status: DC | PRN
  Administered 2019-06-09: 19:00:00 30 mL via ORAL
  Filled 2019-06-09: qty 30

## 2019-06-09 MED ORDER — FENTANYL-BUPIVACAINE-NACL 0.5-0.125-0.9 MG/250ML-% EP SOLN: 12.0000 mL/h | EPIDURAL | Status: DC | PRN

## 2019-06-09 MED ORDER — OXYTOCIN 40 UNITS IN NORMAL SALINE INFUSION - SIMPLE MED: 2.5000 [IU]/h | INTRAVENOUS | Status: AC

## 2019-06-09 MED ORDER — HYDROMORPHONE HCL 1 MG/ML IJ SOLN: 0.2500 mg | INTRAMUSCULAR | Status: DC | PRN

## 2019-06-09 MED ORDER — OXYCODONE HCL 5 MG PO TABS
5.0000 mg | ORAL_TABLET | ORAL | Status: DC | PRN
  Administered 2019-06-10 – 2019-06-11 (×2): 5 mg via ORAL
  Administered 2019-06-11: 10 mg via ORAL
  Filled 2019-06-09 (×2): qty 1
  Filled 2019-06-09: qty 2

## 2019-06-09 MED ORDER — OXYTOCIN BOLUS FROM INFUSION: 500.0000 mL | Freq: Once | INTRAVENOUS | Status: DC

## 2019-06-09 MED ORDER — LACTATED RINGERS IV BOLUS
500.0000 mL | Freq: Once | INTRAVENOUS | Status: AC
  Administered 2019-06-09: 500 mL via INTRAVENOUS

## 2019-06-09 MED ORDER — ACETAMINOPHEN 500 MG PO TABS
1000.0000 mg | ORAL_TABLET | Freq: Four times a day (QID) | ORAL | Status: DC
  Administered 2019-06-09 – 2019-06-11 (×7): 1000 mg via ORAL
  Filled 2019-06-09 (×8): qty 2

## 2019-06-09 MED ORDER — LACTATED RINGERS IV SOLN
INTRAVENOUS | Status: DC
  Administered 2019-06-09 (×2): via INTRAVENOUS

## 2019-06-09 MED ORDER — OXYCODONE-ACETAMINOPHEN 5-325 MG PO TABS: 1.0000 | ORAL_TABLET | ORAL | Status: DC | PRN

## 2019-06-09 MED ORDER — LIDOCAINE HCL (PF) 1 % IJ SOLN
INTRAMUSCULAR | Status: DC | PRN
  Administered 2019-06-09: 5 mL via EPIDURAL

## 2019-06-09 MED ORDER — DIBUCAINE (PERIANAL) 1 % EX OINT: 1.0000 "application " | TOPICAL_OINTMENT | CUTANEOUS | Status: DC | PRN

## 2019-06-09 MED ORDER — NALBUPHINE HCL 10 MG/ML IJ SOLN
5.0000 mg | INTRAMUSCULAR | Status: DC | PRN
  Filled 2019-06-09: qty 0.5

## 2019-06-09 MED ORDER — FENTANYL-BUPIVACAINE-NACL 0.5-0.125-0.9 MG/250ML-% EP SOLN
12.0000 mL/h | EPIDURAL | Status: DC | PRN
  Filled 2019-06-09: qty 250

## 2019-06-09 MED ORDER — SIMETHICONE 80 MG PO CHEW: 80.0000 mg | CHEWABLE_TABLET | ORAL | Status: DC | PRN

## 2019-06-09 MED ORDER — SODIUM CHLORIDE (PF) 0.9 % IJ SOLN
INTRAMUSCULAR | Status: DC | PRN
  Administered 2019-06-09: 12 mL/h via EPIDURAL

## 2019-06-09 MED ORDER — NALOXONE HCL 4 MG/10ML IJ SOLN
1.0000 ug/kg/h | INTRAVENOUS | Status: DC | PRN
  Filled 2019-06-09: qty 5

## 2019-06-09 MED ORDER — LACTATED RINGERS IV SOLN
INTRAVENOUS | Status: DC | PRN
  Administered 2019-06-09 (×2): via INTRAVENOUS

## 2019-06-09 MED ORDER — ONDANSETRON HCL 4 MG/2ML IJ SOLN
4.0000 mg | Freq: Four times a day (QID) | INTRAMUSCULAR | Status: DC | PRN
  Administered 2019-06-09: 4 mg via INTRAVENOUS

## 2019-06-09 MED ORDER — DIPHENHYDRAMINE HCL 25 MG PO CAPS
25.0000 mg | ORAL_CAPSULE | ORAL | Status: DC | PRN
  Administered 2019-06-10: 25 mg via ORAL
  Filled 2019-06-09: qty 1

## 2019-06-09 MED ORDER — KETOROLAC TROMETHAMINE 30 MG/ML IJ SOLN: 30.0000 mg | Freq: Once | INTRAMUSCULAR | Status: DC | PRN

## 2019-06-09 MED ORDER — KETOROLAC TROMETHAMINE 30 MG/ML IJ SOLN
30.0000 mg | Freq: Four times a day (QID) | INTRAMUSCULAR | Status: AC
  Administered 2019-06-10 (×3): 30 mg via INTRAVENOUS
  Filled 2019-06-09 (×4): qty 1

## 2019-06-09 MED ORDER — ZOLPIDEM TARTRATE 5 MG PO TABS: 5.0000 mg | ORAL_TABLET | Freq: Every evening | ORAL | Status: DC | PRN

## 2019-06-09 MED ORDER — HYDROMORPHONE HCL 1 MG/ML IJ SOLN: 0.2000 mg | INTRAMUSCULAR | Status: DC | PRN

## 2019-06-09 MED ORDER — OXYTOCIN 40 UNITS IN NORMAL SALINE INFUSION - SIMPLE MED
INTRAVENOUS | Status: AC
  Filled 2019-06-09: qty 1000

## 2019-06-09 MED ORDER — LIDOCAINE-EPINEPHRINE (PF) 2 %-1:200000 IJ SOLN
INTRAMUSCULAR | Status: AC
  Filled 2019-06-09: qty 10

## 2019-06-09 MED ORDER — LIDOCAINE-EPINEPHRINE (PF) 2 %-1:200000 IJ SOLN
INTRAMUSCULAR | Status: DC | PRN
  Administered 2019-06-09 (×3): 5 mL via EPIDURAL

## 2019-06-09 MED ORDER — OXYCODONE-ACETAMINOPHEN 5-325 MG PO TABS: 2.0000 | ORAL_TABLET | ORAL | Status: DC | PRN

## 2019-06-09 MED ORDER — COCONUT OIL OIL: 1.0000 "application " | TOPICAL_OIL | Status: DC | PRN

## 2019-06-09 MED ORDER — DIPHENHYDRAMINE HCL 25 MG PO CAPS: 25.0000 mg | ORAL_CAPSULE | Freq: Four times a day (QID) | ORAL | Status: DC | PRN

## 2019-06-09 MED ORDER — SODIUM BICARBONATE 8.4 % IV SOLN
INTRAVENOUS | Status: AC
  Filled 2019-06-09: qty 50

## 2019-06-09 MED ORDER — NALOXONE HCL 0.4 MG/ML IJ SOLN: 0.4000 mg | INTRAMUSCULAR | Status: DC | PRN

## 2019-06-09 MED ORDER — SCOPOLAMINE 1 MG/3DAYS TD PT72: 1.0000 | MEDICATED_PATCH | Freq: Once | TRANSDERMAL | Status: DC

## 2019-06-09 MED ORDER — CEFAZOLIN SODIUM-DEXTROSE 2-3 GM-%(50ML) IV SOLR
INTRAVENOUS | Status: DC | PRN
  Administered 2019-06-09: 2 g via INTRAVENOUS

## 2019-06-09 MED ORDER — TETANUS-DIPHTH-ACELL PERTUSSIS 5-2.5-18.5 LF-MCG/0.5 IM SUSP: 0.5000 mL | Freq: Once | INTRAMUSCULAR | Status: DC

## 2019-06-09 MED ORDER — MORPHINE SULFATE (PF) 0.5 MG/ML IJ SOLN
INTRAMUSCULAR | Status: AC
  Filled 2019-06-09: qty 10

## 2019-06-09 MED ORDER — LIDOCAINE HCL (PF) 1 % IJ SOLN: 30.0000 mL | INTRAMUSCULAR | Status: DC | PRN

## 2019-06-09 MED ORDER — ONDANSETRON HCL 4 MG/2ML IJ SOLN: 4.0000 mg | Freq: Three times a day (TID) | INTRAMUSCULAR | Status: DC | PRN

## 2019-06-09 MED ORDER — WITCH HAZEL-GLYCERIN EX PADS: 1.0000 "application " | MEDICATED_PAD | CUTANEOUS | Status: DC | PRN

## 2019-06-09 SURGICAL SUPPLY — 39 items
BENZOIN TINCTURE PRP APPL 2/3 (GAUZE/BANDAGES/DRESSINGS) ×2 IMPLANT
CHLORAPREP W/TINT 26ML (MISCELLANEOUS) ×2 IMPLANT
CLAMP CORD UMBIL (MISCELLANEOUS) IMPLANT
CLOSURE STERI STRIP 1/2 X4 (GAUZE/BANDAGES/DRESSINGS) ×2 IMPLANT
CLOTH BEACON ORANGE TIMEOUT ST (SAFETY) ×2 IMPLANT
DRAPE C SECTION CLR SCREEN (DRAPES) IMPLANT
DRSG OPSITE POSTOP 4X10 (GAUZE/BANDAGES/DRESSINGS) ×2 IMPLANT
ELECT REM PT RETURN 9FT ADLT (ELECTROSURGICAL) ×2
ELECTRODE REM PT RTRN 9FT ADLT (ELECTROSURGICAL) ×1 IMPLANT
EXTRACTOR VACUUM M CUP 4 TUBE (SUCTIONS) IMPLANT
GLOVE BIO SURGEON STRL SZ7.5 (GLOVE) ×2 IMPLANT
GLOVE BIOGEL PI IND STRL 7.0 (GLOVE) ×1 IMPLANT
GLOVE BIOGEL PI INDICATOR 7.0 (GLOVE) ×1
GOWN STRL REUS W/TWL 2XL LVL3 (GOWN DISPOSABLE) ×2 IMPLANT
GOWN STRL REUS W/TWL LRG LVL3 (GOWN DISPOSABLE) ×4 IMPLANT
KIT ABG SYR 3ML LUER SLIP (SYRINGE) IMPLANT
NEEDLE HYPO 22GX1.5 SAFETY (NEEDLE) ×2 IMPLANT
NEEDLE HYPO 25X5/8 SAFETYGLIDE (NEEDLE) IMPLANT
NS IRRIG 1000ML POUR BTL (IV SOLUTION) ×2 IMPLANT
PACK C SECTION WH (CUSTOM PROCEDURE TRAY) ×2 IMPLANT
PAD OB MATERNITY 4.3X12.25 (PERSONAL CARE ITEMS) ×2 IMPLANT
PENCIL SMOKE EVAC W/HOLSTER (ELECTROSURGICAL) ×2 IMPLANT
RTRCTR C-SECT PINK 25CM LRG (MISCELLANEOUS) ×2 IMPLANT
SPONGE LAP 18X18 RF (DISPOSABLE) ×4 IMPLANT
STRIP CLOSURE SKIN 1/2X4 (GAUZE/BANDAGES/DRESSINGS) ×2 IMPLANT
SUT CHROMIC 1 CTX 36 (SUTURE) ×4 IMPLANT
SUT VIC AB 1 CT1 36 (SUTURE) ×4 IMPLANT
SUT VIC AB 2-0 CT1 (SUTURE) ×2 IMPLANT
SUT VIC AB 2-0 CT1 27 (SUTURE) ×1
SUT VIC AB 2-0 CT1 TAPERPNT 27 (SUTURE) ×1 IMPLANT
SUT VIC AB 3-0 CT1 27 (SUTURE) ×2
SUT VIC AB 3-0 CT1 TAPERPNT 27 (SUTURE) ×2 IMPLANT
SUT VIC AB 3-0 SH 27 (SUTURE)
SUT VIC AB 3-0 SH 27X BRD (SUTURE) IMPLANT
SUT VIC AB 4-0 KS 27 (SUTURE) ×2 IMPLANT
SYR BULB IRRIGATION 50ML (SYRINGE) IMPLANT
TOWEL OR 17X24 6PK STRL BLUE (TOWEL DISPOSABLE) ×2 IMPLANT
TRAY FOLEY W/BAG SLVR 14FR LF (SET/KITS/TRAYS/PACK) ×2 IMPLANT
WATER STERILE IRR 1000ML POUR (IV SOLUTION) ×2 IMPLANT

## 2019-06-09 NOTE — Discharge Summary (Signed)
Postpartum Discharge Summary     Patient Name: Kelli Christian DOB: 1991/01/12 MRN: [id number]  Date of admission: 06/09/2019 Delivering Provider: Chancy Milroy   Date of discharge: 06/11/2019  Admitting diagnosis: 38WKS,AMBD PN Intrauterine pregnancy: [redacted]w[redacted]d    Secondary diagnosis:  Active Problems:   Abnormal hemoglobin (HCC)   Language barrier affecting health care   Normal labor   Status post primary low transverse cesarean section  Additional problems: Breech/,alpresentation of fetus, failed external cephalic version     Discharge diagnosis: Term Pregnancy Delivered                                                                                                Post partum procedures:blood transfusion  Augmentation:  None  Complications: None  Hospital course:  Onset of Labor With Unplanned C/S  28y.o. yo G3P3003 at 397w2das admitted in Latent Labor on 06/09/2019. Patient had a labor course significant for breech presentation. ECV was attempted and failed.  The patient went for cesarean section due to Malpresentation, and delivered a Viable infant,06/09/2019 with Membrane Rupture Time/Date: 7:27 PM ,06/09/2019. Details of operation can be found in separate operative note. Patient had an uncomplicated postpartum course.  She is ambulating,tolerating a regular diet, passing flatus, and urinating well.  Patient is discharged home in stable condition 06/11/19. Delivery time: 7:31 PM    Magnesium Sulfate received: No BMZ received: No Rhophylac:N/A MMR:N/A Transfusion:Yes  Physical exam  Vitals:   06/10/19 1415 06/10/19 1432 06/10/19 2138 06/11/19 0517  BP: 104/60 (!) 101/58 117/78 99/76  Pulse: 72 72 74 63  Resp: 16 16 18 18   Temp: 98.8 F (37.1 C) 98.1 F (36.7 C) (!) 97.5 F (36.4 C) 97.9 F (36.6 C)  TempSrc: Oral Oral Oral Oral  SpO2:  100%  98%  Weight:      Height:       General: alert and cooperative Lochia: appropriate Uterine Fundus: firm Incision:  Dressing is clean, dry, and intact DVT Evaluation: No evidence of DVT seen on physical exam. Labs: Lab Results  Component Value Date   WBC 11.3 (H) 06/10/2019   HGB 8.9 (L) 06/10/2019   HCT 27.6 (L) 06/10/2019   MCV 73.4 (L) 06/10/2019   PLT 196 06/10/2019   CMP Latest Ref Rng & Units 06/09/2019  Creatinine 0.44 - 1.00 mg/dL 0.39(L)    Discharge instruction: per After Visit Summary and "Baby and Me Booklet".  After visit meds:  Allergies as of 06/11/2019   No Known Allergies     Medication List    TAKE these medications   ferrous sulfate 325 (65 FE) MG EC tablet Take 1 tablet (325 mg total) by mouth 2 (two) times daily with a meal.   ibuprofen 800 MG tablet Commonly known as: ADVIL Take 1 tablet (800 mg total) by mouth every 8 (eight) hours as needed.   oxyCODONE 5 MG immediate release tablet Commonly known as: Oxy IR/ROXICODONE Take 1-2 tablets (5-10 mg total) by mouth every 4 (four) hours as needed for moderate pain.   senna-docusate 8.6-50 MG tablet Commonly known as:  Senokot-S Take 2 tablets by mouth daily. Start taking on: June 12, 2019   Virt-C DHA 53.5-38-1 MG Caps Take 1 capsule by mouth daily.       Diet: routine diet  Activity: Advance as tolerated. Pelvic rest for 6 weeks.   Outpatient follow up:4 weeks Follow up Appt: Future Appointments  Date Time Provider [place]  06/24/2019  8:30 AM [place]  07/19/2019  9:35 AM Chancy Milroy, MD [facility]   Follow up Visit: [place] [facility]. Go on 06/24/2019.   Specialty: Obstetrics and Gynecology Why: Incision Check at 8:30 am  Contact information: [address] [place], [place] [id number] [place] [address] [phone]             Please schedule this patient for Postpartum visit in: 4 weeks with the following provider: MD  For C/S patients schedule nurse incision check in  weeks 2 weeks: yes  Low risk pregnancy complicated by: Breech presentation  Delivery mode: CS  Anticipated Birth Control: other/unsure  PP Procedures needed: Incision check  Schedule Integrated BH visit: no      Newborn Data: Live born female  Birth Weight:  3645g APGAR: 98, 9  Newborn Delivery   Birth date/time: 06/09/2019 19:31:00 Delivery type:       Baby Feeding: Bottle Disposition:home with mother   06/11/2019 Melina Schools, DO

## 2019-06-09 NOTE — H&P (Signed)
OBSTETRIC ADMISSION HISTORY AND PHYSICAL  Kelli Christian is a 28 y.o. female G3P2002 with IUP at [redacted]w[redacted]d by LMP presenting for SOL, found to be breech. She reports +FMs, No LOF, no VB, no blurry vision, headaches or peripheral edema, and RUQ pain.  She plans on breast feeding. She [facility] her [facility]     Prenatal History/Complications:  Past Medical History: Past Medical History:  Diagnosis Date  . Vaginal Pap smear, abnormal     Past Surgical History: Past Surgical History:  Procedure Laterality Date  . LEEP      Obstetrical History: OB History    Gravida  3   Para  2   Term  2   Preterm      AB      Living  2     SAB      TAB      Ectopic      Multiple  0   Live Births  1           Social History Social History   Socioeconomic History  . Marital status: Married    Spouse name: Not on file  . Number of children: Not on file  . Years of education: Not on file  . Highest education level: Not on file  Occupational History  . Not on file  Social Needs  . Financial resource strain: Not on file  . Food insecurity    Worry: Not on file    Inability: Not on file  . Transportation needs    Medical: Not on file    Non-medical: Not on file  Tobacco Use  . Smoking status: Never Smoker  . Smokeless tobacco: Never Used  Substance and Sexual Activity  . Alcohol use: No  . Drug use: No  . Sexual activity: Yes  Lifestyle  . Physical activity    Days per week: Not on file    Minutes per session: Not on file  . Stress: Not on file  Relationships  . Social Herbalist on phone: Not on file    Gets together: Not on file    Attends religious service: Not on file    Active member of club or organization: Not on file    Attends meetings of clubs or organizations: Not on file    Relationship status: Not on file  Other Topics Concern  . Not on file  Social History Narrative  . Not on file    Family History: Family History   Problem Relation Age of Onset  . Alcohol abuse Neg Hx   . Arthritis Neg Hx   . Asthma Neg Hx   . Birth defects Neg Hx   . Cancer Neg Hx   . COPD Neg Hx   . Depression Neg Hx   . Diabetes Neg Hx   . Drug abuse Neg Hx   . Early death Neg Hx   . Hearing loss Neg Hx   . Heart disease Neg Hx   . Hyperlipidemia Neg Hx   . Hypertension Neg Hx   . Kidney disease Neg Hx   . Learning disabilities Neg Hx   . Mental illness Neg Hx   . Mental retardation Neg Hx   . Miscarriages / Stillbirths Neg Hx   . Stroke Neg Hx   . Vision loss Neg Hx   . Varicose Veins Neg Hx     Allergies: No Known Allergies  Medications Prior to Admission  Medication  Sig Dispense Refill Last Dose  . folic acid (FOLVITE) 400 MCG tablet Take 400 mcg by mouth daily.     Marland Kitchen ibuprofen (ADVIL,MOTRIN) 600 MG tablet Take 1 tablet (600 mg total) by mouth every 6 (six) hours. (Patient not taking: Reported on 08/28/2017) 30 tablet 0   . norethindrone (CAMILA) 0.35 MG tablet Take 1 tablet (0.35 mg total) by mouth daily. (Patient not taking: Reported on 03/23/2019) 1 Package 12   . phenylephrine-shark liver oil-mineral oil-petrolatum (PREPARATION H) 0.25-3-14-71.9 % rectal ointment Place 1 application rectally 2 (two) times daily as needed for hemorrhoids. (Patient not taking: Reported on 03/23/2019) 30 g 1   . Prenat-FeFum-FePo-FA-Omega 3 (VIRT-C DHA) 53.5-38-1 MG CAPS Take 1 capsule by mouth daily.        Review of Systems   All systems reviewed and negative except as stated in HPI  Blood pressure 116/71, pulse 77, temperature 98.3 F (36.8 C), temperature source Oral, resp. rate 18, height 5\' 5"  (1.651 m), weight 77.1 kg, last menstrual period 08/23/2018, SpO2 100 %, currently breastfeeding. General appearance: alert, cooperative and appears stated age Lungs: clear to auscultation bilaterally Heart: regular rate and rhythm Abdomen: soft, non-tender; bowel sounds normal Pelvic: adequate Extremities: Homans sign is  negative, no sign of DVT DTR's wnl Presentation: breech Fetal monitoringBaseline: 130 bpm, Variability: Good {> 6 bpm), Accelerations: Reactive and Decelerations: Absent Uterine activityFrequency: Every 5-7 minutes Dilation: 4.5 Exam by:: [email or web address], RN   Prenatal labs: ABO, Rh: --/--/PENDING (09/30 1230) Antibody: PENDING (09/30 1230) Rubella:  Immune RPR:   Nonreactive HBsAg:   Negative HIV:   NR GBS:   NEGATIVE 1 hr Glucola 91 Genetic screening   Anatomy 01-09-2006 wnl  Prenatal Transfer Tool  Maternal Diabetes: No Genetic Screening: Normal Maternal Ultrasounds/Referrals: Normal Fetal Ultrasounds or other Referrals:  None Maternal Substance Abuse:  No Significant Maternal Medications:  None Significant Maternal Lab Results: Group B Strep negative  Results for orders placed or performed during the hospital encounter of 06/09/19 (from the past 24 hour(s))  CBC   Collection Time: 06/09/19 12:30 PM  Result Value Ref Range   WBC >12.0 (H) 4.0 - 10.5 K/uL   RBC 4.87 3.87 - 5.11 MIL/uL   Hemoglobin 10.5 (L) 12.0 - 15.0 g/dL   HCT 06/11/19 (L) 95.0 - 93.2 %   MCV 69.2 (L) 80.0 - 100.0 fL   MCH 21.6 (L) 26.0 - 34.0 pg   MCHC 31.2 30.0 - 36.0 g/dL   RDW 67.1 (H) 24.5 - 80.9 %   Platelets 251 150 - 400 K/uL   nRBC 0.0 0.0 - 0.2 %  Type and screen [facility]   Collection Time: 06/09/19 12:30 PM  Result Value Ref Range   ABO/RH(D) PENDING    Antibody Screen PENDING    Sample Expiration      06/12/2019,2359 Performed at [facility], [address] [place], [place]     Patient Active Problem List   Diagnosis Date Noted  . Normal labor 06/09/2019  . Language barrier affecting health care 03/05/2016  . Abnormal hemoglobin (HCC) 11/04/2015    Assessment/Plan:  Kelli Christian is a 28 y.o. G3P2002 at [redacted]w[redacted]d here for SOL, breech presentation  #Labor: Discussed options of ECV vs CS. Patient would like to have attempted ECV and was consented for this  procedure. We discussed risk of failure and need for pLTCS, fetal intolerance and discomfort. We will reduce discomfort with regional anesthesia. She expressed a desire to  attempt ECV and voice understanding that we may need to go for urgent CS vs non-urgent if unable to convert to vertex.   #Pain: Epidural to help with regional anesthesia for ECV #FWB: Cat I #ID:  GBS neg #MOF: breast #MOC: will address PP #Circ: Female, will ask.   Federico FlakeKimberly Niles Jireh Vinas, MD  06/09/2019, 2:07 PM

## 2019-06-09 NOTE — Transfer of Care (Signed)
Immediate Anesthesia Transfer of Care Note  Patient: Kelli Christian  Procedure(s) Performed: CESAREAN SECTION (N/A )  Patient Location: PACU  Anesthesia Type:Epidural  Level of Consciousness: awake  Airway & Oxygen Therapy: Patient Spontanous Breathing  Post-op Assessment: Report given to RN and Post -op Vital signs reviewed and stable  Post vital signs: stable  Last Vitals:  Vitals Value Taken Time  BP 99/69 06/09/19 2028  Temp    Pulse 82 06/09/19 2030  Resp 18 06/09/19 2030  SpO2 93 % 06/09/19 2030  Vitals shown include unvalidated device data.  Last Pain:  Vitals:   06/09/19 1834  TempSrc: Oral  PainSc: 0-No pain         Complications: No apparent anesthesia complications

## 2019-06-09 NOTE — Anesthesia Preprocedure Evaluation (Addendum)
Anesthesia Evaluation  Patient identified by MRN, date of birth, ID band Patient awake    Reviewed: Allergy & Precautions, NPO status , Patient's Chart, lab work & pertinent test results  Airway Mallampati: II  TM Distance: >3 FB Neck ROM: Full    Dental  (+) Dental Advisory Given   Pulmonary neg pulmonary ROS,    Pulmonary exam normal breath sounds clear to auscultation       Cardiovascular negative cardio ROS Normal cardiovascular exam Rhythm:Regular Rate:Normal     Neuro/Psych negative neurological ROS  negative psych ROS   GI/Hepatic negative GI ROS, Neg liver ROS,   Endo/Other  negative endocrine ROS  Renal/GU negative Renal ROS  negative genitourinary   Musculoskeletal negative musculoskeletal ROS (+)   Abdominal (+) - obese,   Peds negative pediatric ROS (+)  Hematology negative hematology ROS (+)   Anesthesia Other Findings   Reproductive/Obstetrics negative OB ROS (+) Pregnancy                            Anesthesia Physical Anesthesia Plan  ASA: II  Anesthesia Plan: Epidural   Post-op Pain Management:    Induction:   PONV Risk Score and Plan:   Airway Management Planned: Simple Face Mask  Additional Equipment: None  Intra-op Plan:   Post-operative Plan:   Informed Consent: I have reviewed the patients History and Physical, chart, labs and discussed the procedure including the risks, benefits and alternatives for the proposed anesthesia with the patient or authorized representative who has indicated his/her understanding and acceptance.     Dental advisory given  Plan Discussed with:   Anesthesia Plan Comments: (Lab Results      Component                Value               Date                      WBC                      12.0 (H)            06/09/2019                HGB                      10.5 (L)            06/09/2019                HCT                       33.7 (L)            06/09/2019                MCV                      69.2 (L)            06/09/2019                PLT                      251                 06/09/2019  COVID-19 Labs  No results for input(s): DDIMER, FERRITIN, LDH, CRP in the last 72 hours.  Lab Results      Component                Value               Date                      [id number]              NEGATIVE            06/09/2019                Desha              Not Detected        05/06/2019            )       Anesthesia Quick Evaluation

## 2019-06-09 NOTE — Progress Notes (Signed)
Pt has cut on upper abd that is bleeding after failed version. Wound has been cleaned and tegaderm was applied.

## 2019-06-09 NOTE — Progress Notes (Signed)
Kelli Christian is a 28 y.o. G3P2002 at [redacted]w[redacted]d by LMP presenting for SOL, found to be breech.   Epidural was placed, and the patient is now comfortable. She does not feel her contractions. She has been consented for external cephalic version (as per Dr. Romona Curls H&P).  Objective: BP 112/65   Pulse 68   Temp 97.7 F (36.5 C) (Oral)   Resp 16   Ht 5\' 5"  (1.651 m)   Wt 77.1 kg   LMP 08/23/2018 (Approximate)   SpO2 99%   BMI 28.29 kg/m  No intake/output data recorded.  FHT:  FHR: 130 bpm, variability: moderate,  accelerations:  Present,  decelerations:  Absent UC:   irregular, every 9 minutes  SVE:   Dilation: 4.5 Exam by:: Reed Pandy, RN  Procedure Note:  After informed verbal consent, ECV was attempted under epidural anesthesia and Ultrasound guidance.  Forward roll was attempted three times without success.   FHR was reactive before and after the procedure.   Pt. Tolerated the procedure well.  Assessment / Plan: 1. Breech presentation at 39.2 EGA 2. Failed External Cephalic Version  The risks of cesarean section were discussed with the patient including but were not limited to: bleeding which may require transfusion or reoperation; infection which may require antibiotics; injury to bowel, bladder, ureters or other surrounding organs; injury to the fetus; need for additional procedures including hysterectomy in the event of a life-threatening hemorrhage; placental abnormalities wth subsequent pregnancies, incisional problems, thromboembolic phenomenon and other postoperative/anesthesia complications. The patient concurred with the proposed plan, giving informed written consent for the procedures.  Patient has been NPO since 0900, she will remain NPO for procedure. Anesthesia and OR aware.  Preoperative prophylactic antibiotics and SCDs ordered on call to the OR.  To OR when ready.     Merilyn Baba DO OB Fellow, Faculty Practice 06/09/2019, 6:25 PM

## 2019-06-09 NOTE — Progress Notes (Signed)
[place] interpreter [phone] present for translating in PACU.   Juluis Mire, RN

## 2019-06-09 NOTE — Progress Notes (Signed)
[place] interpreter # [phone] present for translating in [facility] with team.  Juluis Mire, RN

## 2019-06-09 NOTE — Anesthesia Procedure Notes (Signed)
Epidural Patient location during procedure: OB Start time: 06/09/2019 3:00 PM End time: 06/09/2019 3:06 PM  Staffing Anesthesiologist: Effie Berkshire, MD Performed: anesthesiologist   Preanesthetic Checklist Completed: patient identified, site marked, surgical consent, pre-op evaluation, timeout performed, IV checked, risks and benefits discussed and monitors and equipment checked  Epidural Patient position: sitting Prep: ChloraPrep Patient monitoring: heart rate, continuous pulse ox and blood pressure Approach: midline Location: L3-L4 Injection technique: LOR saline  Needle:  Needle type: Tuohy  Needle gauge: 17 G Needle length: 9 cm Catheter type: closed end flexible Catheter size: 20 Guage Test dose: negative and 1.5% lidocaine  Assessment Events: blood not aspirated, injection not painful, no injection resistance and no paresthesia  Additional Notes LOR @ 4.5  Patient identified. Risks/Benefits/Options discussed with patient including but not limited to bleeding, infection, nerve damage, paralysis, failed block, incomplete pain control, headache, blood pressure changes, nausea, vomiting, reactions to medications, itching and postpartum back pain. Confirmed with bedside nurse the patient's most recent platelet count. Confirmed with patient that they are not currently taking any anticoagulation, have any bleeding history or any family history of bleeding disorders. Patient expressed understanding and wished to proceed. All questions were answered. Sterile technique was used throughout the entire procedure. Please see nursing notes for vital signs. Test dose was given through epidural catheter and negative prior to continuing to dose epidural or start infusion. Warning signs of high block given to the patient including shortness of breath, tingling/numbness in hands, complete motor block, or any concerning symptoms with instructions to call for help. Patient was given instructions  on fall risk and not to get out of bed. All questions and concerns addressed with instructions to call with any issues or inadequate analgesia.    Reason for block:procedure for pain

## 2019-06-09 NOTE — Progress Notes (Signed)
Pt informed that the ultrasound is considered a limited OB ultrasound and is not intended to be a complete ultrasound exam.  Patient also informed that the ultrasound is not being completed with the intent of assessing for fetal or placental anomalies or any pelvic abnormalities.  Explained that the purpose of today's ultrasound is to assess for  presentation.  Patient acknowledges the purpose of the exam and the limitations of the study.    BREECH  L&D attending notified by patient's RN  Marcille Buffy DNP, CNM  06/09/19  12:42 PM

## 2019-06-09 NOTE — Op Note (Signed)
Operative Note   SURGERY DATE: 06/09/2019  PRE-OP DIAGNOSIS:  *Pregnancy at 39 weeks *Breech malpresentation of fetus *Failed External Cephalic Version  POST-OP DIAGNOSIS:  *Pregnancy at 39 weeks *Breech malpresentation of fetus *Failed External Cephalic Version   PROCEDURE: primary low transverse cesarean section via pfannenstiel skin incision with double layer uterine closure  SURGEON: Surgeon(s) and Role:    * Hermina Staggers, MD - Primary    * Tayva Easterday L, DO - Fellow  ASSISTANT: None  ANESTHESIA: epidural  ESTIMATED BLOOD LOSS: 884  DRAINS: 200 mL UOP via indwelling foley  TOTAL IV FLUIDS: 2300 mL crystalloid  VTE PROPHYLAXIS: SCDs to bilateral lower extremities  ANTIBIOTICS: Two grams of Cefazolin were given., within 1 hour of skin incision  SPECIMENS: None  COMPLICATIONS: ~1.5 cm superficial laceration over left flank, bleeding controlled with brief pressure applied to area  INDICATIONS: Failed ECV, breach malpresentation  FINDINGS: No intra-abdominal adhesions were noted. Grossly normal uterus, tubes and ovaries. clear amniotic fluid, breech female infant, weight per medical record, APGARs 8/9, intact placenta.  PROCEDURE IN DETAIL: The patient was taken to the operating room where anesthesia was administered and normal fetal heart tones were confirmed. She was then prepped and draped in the normal fashion in the dorsal supine position with a leftward tilt.  After a time out was performed, a pfannensteil skin incision was made with the scalpel and carried through to the underlying layer of fascia. The fascia was then incised at the midline and this incision was extended laterally with the mayo scissors. Attention was turned to the superior aspect of the fascial incision which was grasped with the kocher clamps x 2, tented up and the rectus muscles were dissected off bluntly and with the curved mayos. In a similar fashion the inferior aspect of the fascial  incision was grasped with the kocher clamps, tented up and the rectus muscles dissected off with the mayo scissors. The rectus muscles were then separated in the midline and the peritoneum was entered bluntly. The Alexis retractor was inserted and the vesicouterine peritoneum was identified.   A low transverse hysterotomy was made with the scalpel until the endometrial cavity was breached and the scalpel grazed over the presenting left flank. This incision was extended bluntly and the infant's buttocks, legs and body were delivered atraumatically. The corpus was rotated and the arms were swept downward. The head was flexed as traction was applied and delivered atraumatically. The cord was clamped x 2 and cut, and the infant was handed to the awaiting pediatricians, after delayed cord clamping was done.  The placenta was then gradually expressed from the uterus and then the uterus was exteriorized and cleared of all clots and debris. The hysterotomy was repaired with a running suture of 1-0 Gut. A second imbricating layer of 1-0 Gut suture was then placed, acheiving excellent hemostasis.   The uterus and adnexa were then returned to the abdomen, and the hysterotomy and all operative sites were reinspected and excellent hemostasis was noted after irrigation and suction of the abdomen with warm saline.  The peritoneum and rectus muscles were closed with a running stitch of 3-0 Vicryl. The fascia was reapproximated with 1-0 Vicryl in a simple running fashion bilaterally. The subcutaneous layer was then reapproximated with interrupted sutures of 2-0 vicryl, and the skin was then closed with 4-0 vicryl, in a subcuticular fashion.  The patient  tolerated the procedure well. Sponge, lap, needle, and instrument counts were correct x 2. The patient  was transferred to the recovery room awake, alert and breathing independently in stable condition.  Merilyn Baba, DO OB Fellow, Faculty Practice 06/09/2019 9:07  PM

## 2019-06-09 NOTE — Progress Notes (Signed)
Patient ID: Kelli Christian, female   DOB: Sep 01, 1991, 28 y.o.   MRN: [id number] [facility] Attending  Attempted ECV with OB fellow. Unable to vert after 3 attempts. Last delivery was somewhat difficult and pt feels this infant is larger. C section recommended to pt.  R/B/Post op care reviewed with pt. Nursing and anesthesia aware. Will proceed to OR when ready.

## 2019-06-09 NOTE — MAU Note (Signed)
Pt presents to MAU with c/o vaginal bleeding and ctx that started today around 9 am. +FM

## 2019-06-10 ENCOUNTER — Encounter (HOSPITAL_COMMUNITY): Payer: Self-pay | Admitting: Obstetrics and Gynecology

## 2019-06-10 LAB — CBC
HCT: 22.5 % — ABNORMAL LOW (ref 36.0–46.0)
HCT: 27.6 % — ABNORMAL LOW (ref 36.0–46.0)
Hemoglobin: 6.8 g/dL — CL (ref 12.0–15.0)
Hemoglobin: 8.9 g/dL — ABNORMAL LOW (ref 12.0–15.0)
MCH: 20.9 pg — ABNORMAL LOW (ref 26.0–34.0)
MCH: 23.7 pg — ABNORMAL LOW (ref 26.0–34.0)
MCHC: 30.2 g/dL (ref 30.0–36.0)
MCHC: 32.2 g/dL (ref 30.0–36.0)
MCV: 69.2 fL — ABNORMAL LOW (ref 80.0–100.0)
MCV: 73.4 fL — ABNORMAL LOW (ref 80.0–100.0)
Platelets: 202 10*3/uL (ref 150–400)
Platelets: 196 10*3/uL (ref 150–400)
RBC: 3.25 MIL/uL — ABNORMAL LOW (ref 3.87–5.11)
RBC: 3.76 MIL/uL — ABNORMAL LOW (ref 3.87–5.11)
RDW: 16.2 % — ABNORMAL HIGH (ref 11.5–15.5)
RDW: 19.3 % — ABNORMAL HIGH (ref 11.5–15.5)
WBC: 12.1 10*3/uL — ABNORMAL HIGH (ref 4.0–10.5)
WBC: 11.3 10*3/uL — ABNORMAL HIGH (ref 4.0–10.5)
nRBC: 0 % (ref 0.0–0.2)
nRBC: 0 % (ref 0.0–0.2)

## 2019-06-10 LAB — PREPARE RBC (CROSSMATCH)

## 2019-06-10 LAB — RPR: RPR Ser Ql: NONREACTIVE

## 2019-06-10 MED ORDER — INFLUENZA VAC SPLIT QUAD 0.5 ML IM SUSY
0.5000 mL | PREFILLED_SYRINGE | INTRAMUSCULAR | Status: AC
  Administered 2019-06-11: 0.5 mL via INTRAMUSCULAR
  Filled 2019-06-10: qty 0.5

## 2019-06-10 MED ORDER — SODIUM CHLORIDE 0.9% IV SOLUTION: Freq: Once | INTRAVENOUS | Status: DC

## 2019-06-10 NOTE — Anesthesia Postprocedure Evaluation (Signed)
Anesthesia Post Note  Patient: Kelli Christian  Procedure(s) Performed: CESAREAN SECTION (N/A )     Patient location during evaluation: PACU Anesthesia Type: Epidural Level of consciousness: awake and alert Pain management: pain level controlled Vital Signs Assessment: post-procedure vital signs reviewed and stable Respiratory status: spontaneous breathing and respiratory function stable Cardiovascular status: blood pressure returned to baseline and stable Postop Assessment: epidural receding Anesthetic complications: no    Last Vitals:  Vitals:   06/09/19 2200 06/09/19 2259  BP: 113/71 115/60  Pulse: 78 82  Resp: 18 18  Temp: 37.1 C 37.1 C  SpO2: 99% 99%    Last Pain:  Vitals:   06/09/19 2302  TempSrc:   PainSc: 3                  Nolon Nations

## 2019-06-10 NOTE — Progress Notes (Signed)
Subjective: Postpartum Day 1: Cesarean Delivery Patient reports incisional pain and tolerating PO.  Has some dizziness when out of bed Breastfeeding well  Objective: Vital signs in last 24 hours: Temp:  [97.6 F (36.4 C)-99.5 F (37.5 C)] 99.1 F (37.3 C) (10/01 0515) Pulse Rate:  [68-96] 79 (10/01 0515) Resp:  [15-24] 18 (10/01 0515) BP: (92-138)/(51-82) 109/62 (10/01 0515) SpO2:  [95 %-100 %] 100 % (10/01 0515) Weight:  [77.1 kg] 77.1 kg (09/30 1341)  Physical Exam:  General: alert, cooperative and no distress Lochia: appropriate Uterine Fundus: firm Incision: no significant drainage, dressing has small area of old blood DVT Evaluation: No evidence of DVT seen on physical exam.  Recent Labs    06/09/19 2257 06/10/19 0609  HGB 8.1* 6.8*  HCT 25.0* 22.5*    Assessment/Plan: Status post Cesarean section. Doing well postoperatively.  Consider transfusion if remains symptomatic Continue current care.  Hansel Feinstein 06/10/2019, 7:10 AM

## 2019-06-10 NOTE — Progress Notes (Signed)
Anemia discussed with patient with [place] interpreter. Patient is agreeable to transfusion.

## 2019-06-10 NOTE — Progress Notes (Signed)
Pt hemoglobin 6.8. Notified Dr. Dara Lords. No new orders at this time.

## 2019-06-10 NOTE — Lactation Note (Signed)
This note was copied from a baby's chart. Lactation Consultation Note  Patient Name: Kelli Christian Today's Date: 06/10/2019 Reason for consult: Initial assessment;Term P3.  Video I pad interpreter used for visit.  Mom states she breastfed her previous babies for 2 years.  Newborn is feeding well but mom desires to supplement with formula until milk comes to volume.  Mom can easily hand express large drops of colostrum.  Reviewed the benefits of colostrum and milk coming to volume 3-5 days after birth.  Discussed supply and demand and stressed importance of putting baby to breast with cues prior to giving formula.  Mom has erect nipples and compressible breast tissue.  No questions.  Encouraged to call for assist prn.  Maternal Data Has patient been taught Hand Expression?: Yes Does the patient have breastfeeding experience prior to this delivery?: Yes  Feeding Feeding Type: Breast Fed  LATCH Score                   Interventions Interventions: Breast feeding basics reviewed  Lactation Tools Discussed/Used     Consult Status Consult Status: Follow-up Date: 06/11/19 Follow-up type: In-patient    Ave Filter 06/10/2019, 12:08 PM

## 2019-06-11 DIAGNOSIS — Z98891 History of uterine scar from previous surgery: Secondary | ICD-10-CM

## 2019-06-11 LAB — TYPE AND SCREEN
ABO/RH(D): O POS
Antibody Screen: NEGATIVE
Unit division: 0
Unit division: 0

## 2019-06-11 LAB — BPAM RBC
Blood Product Expiration Date: 202011032359
Blood Product Expiration Date: 202011032359
ISSUE DATE / TIME: 202010010925
ISSUE DATE / TIME: 202010011203
Unit Type and Rh: 5100
Unit Type and Rh: 5100

## 2019-06-11 MED ORDER — IBUPROFEN 800 MG PO TABS: 800.0000 mg | ORAL_TABLET | Freq: Three times a day (TID) | ORAL | 1 refills | Status: DC | PRN

## 2019-06-11 MED ORDER — OXYCODONE HCL 5 MG PO TABS: 5.0000 mg | ORAL_TABLET | ORAL | 0 refills | Status: DC | PRN

## 2019-06-11 MED ORDER — FERROUS SULFATE 325 (65 FE) MG PO TBEC: 325.0000 mg | DELAYED_RELEASE_TABLET | Freq: Two times a day (BID) | ORAL | 3 refills | Status: DC

## 2019-06-11 MED ORDER — SENNOSIDES-DOCUSATE SODIUM 8.6-50 MG PO TABS: 2.0000 | ORAL_TABLET | ORAL | 0 refills | Status: DC

## 2019-06-11 NOTE — Discharge Instructions (Signed)
Sinh m?, Ch?m [place] sau m? Cesarean Delivery, Care After T? thng tin ny cung c?p cho qu v? thng tin v? cch t? ch?m [place] b?n thn sau khi lm th? thu?t. Chuyn gia ch?m [place] s?c kh?e c?ng c th? c h??ng d?n c? th? h?n cho qu v?. Hy lin l?c v?i chuyn gia ch?m [place] s?c kh?e n?u qu v? c cc v?n ?? ho?c th?c m?c. Ti c th? d? ki?n ?i?u g s? x?y ra sau khi lm th? thu?t? Sau khi lm th? thu?t th??ng b?:  M?t l???ng ma?u nho? ho??c di?ch trong cha?y ra ? v?t m?.  M?t cht t?y ??, s?ng n? v ?au t?i khu v?c v?t m?.  ?au v ?au nh?c m?t cht ? b?ng.  Ch?y mu m ??o (s?n d?ch). M?c d qu v? khng sinh n? qua ???ng m ??o nh?ng qu v? s? v?n c mu v ti?t d?ch ? m ??o.  Co th??t ? vu?ng ch?u.  M?t m?i. Qu v? c th? b? ?au, s?ng v c?m gic kh ch?u ? m gi?a m ??o v h?u mn (t?ng sinh mn) n?u:  Vi?c sinh m? c?a qu v? n?m ngoi k? ho?ch v qu v? ???c cho chuy?n d? v ??y [place] ra.  M?t v?t m? ???c t?o ra trong khu v?c ? (th? thu?t c?t t?ng sinh mn) ho?c m b? rch trong khi c? g?ng sinh con qua m ??o. Tun th? nh?ng h??ng d?n ny ? nh: Ch?m [place] v?t m?   Tun th? ch? d?n c?a chuyn gia ch?m [place] s?c kh?e v? cch ch?m [place] v?t m?. ??m b?o qu v?: ? R?a tay b?ng x phng v n??c tr??c khi qu v? thay b?ng (b?ng g?c). N?u khng c x phng v n??c, hy dng thu?c st trng tay. ? N?u qu v? ???c qu?n b?ng, hy thay ho?c tho b?ng theo ch? d?n c?a chuyn gia ch?m [place] s?c kh?e. ? ?? cc m?i khu (ch? ph?u thu?t), ghim khu ph?u thu?t, keo dn da, ho?c cc d?i b?ng dnh t?i ch?. Nh?ng th? dng ?? ?ng v?t m? trn da ny c th? c?n ?? nguyn t?i ch? trong 2 tu?n ho?c lu h?n. N?u cc mp d?i b?ng dnh b?t ??u long ra v cong ln, qu v? c th? c?t nh?ng ch? mp b? long ra ?Maggie Schwalbe bc h?t cc d?i b?ng dnh ra tr? khi chuyn gia ch?m [place] s?c kh?e b?o qu v? lm nh? v?y.  Ki?m tra vng c v?t m? m?i ngy xem c cc d?u hi?u nhi?m trng hay khng. Ki?m tra xem c: ? ??, s?ng n?,  ho?c ?au nhi?u h?n. ? Nhi?u d?ch ho?c mu h?n. ? ?m. ? M? ho?c mi hi.  Khng t?m b?n, b?i ho?c s? d?ng b?n t?m n??c nng cho ??n khi chuyn gia ch?m [place] s?c kh?e ni r?ng qu v? c th? lm ?i?u ?. Hy h?i chuyn gia ch?m  s?c kh?e xem qu v? c th? t?m vi hoa sen khng.  Khi qu v? ho ho?c h?t h?i, hy m m?t ci g?i. Vi?c ny gip gi?m ?au v gi?m kh? n?ng v?t m? c?a qu v? b? h mi?ng (n?t ra). Lm nh? v?y cho ??n khi v?t m? c?a qu v? lnh l?i. Thu?c  Ch? s? d?ng thu?c khng k ??n v thu?c k ??n theo ch? d?n c?a chuyn gia ch?m  s?c kh?e.  N?u qu v? ???c k thu?c khng sinh, hy dng thu?c theo ch? d?n c?a [place]  gia ch?m [place] s?c kh?e. Khng d?ng u?ng thu?c khng sinh ngay c? khi qu v? b?t ??u c?m th?y ?? h?n.  Khng li xe ho?c s? d?ng my mc h?ng n?ng trong khi dng thu?c gi?m ?au k ??n. L?i s?ng  Khng u?ng r??u. ?i?u na?y ??c bi?t quan tro?ng n?u quy? vi? ?ang cho con b me? ho??c ?ang du?ng thu?c gia?m ?au.  Khng s? d?ng b?t k? s?n ph?m no c nicotine ho?c thu?c l, ch?ng ha?n nh? thu?c l d?ng ht, thu?c l ?i?n t? v thu?c l d?ng nhai. N?u qu v? c?n gip ?? ?? cai thu?c, hy h?i chuyn gia ch?m [place] s?c kh?e. ?n v u?ng  U?ng i?t nh?t 8 ly m?i ly ta?m au-x? n???c m?i nga?y tr?? khi chuyn gia ch?m so?c s??c kho?e ba?o quy? vi? khng la?m v?y. N?u quy? vi? cho con b m?, quy? vi? co? th? c?n u?ng nhi?u n???c h?n n?a.  ?n ca?c th??c ph?m nhi?u ch?t x? m?i nga?y. Nh??ng th??c ph?m na?y co? th? gip ng?n ng?a ho??c gia?m ta?o bo?n. Th??c ph?m nhi?u ch?t x? bao g?m: ? Ngu? c?c va? ba?nh mi? nguyn h?t. ? Ga?o l??t. ? ??u. ? Tri cy v rau c? t??i. Ho?t ??ng   N?u co? th?, ha?y nh?? ai giu?p quy? vi? vi?c nha? va? giu?p ch?m so?c con quy? vi? trong i?t nh?t va?i nga?y sau khi quy? vi? ra vi?n.  Tr? l?i sinh ho?t bnh th??ng theo ch? d?n c?a chuyn gia ch?m [place] s?c kh?e. Hy h?i chuyn gia ch?m [place] s?c kh?e v? cc ho?t ??ng no l  an ton cho qu v?.  Ngh? ng?i cng nhi?u cng t?t. C? g??ng nghi? ng?i ho?c ngu? ch??p m??t khi con quy? vi? ngu?Imagene Sheller.  Khng nng b?t c? v?t gi? n?ng qu 10 lb (4,5 kg), ho??c qu gi??i h?n m qu v? ???c ch? d?n, cho ??n khi chuyn gia ch?m [place] s?c kh?e c?a qu v? ni r?ng vi?c ? l an ton.  No?i chuy?n v??i chuyn gia ch?m so?c s??c kho?e cu?a quy? vi? v? vi?c khi na?o quy? vi? co? th? quan h? ti?nh du?c. ?i?u na?y c th? phu? thu?c va?o: ? Nguy c? nhi?m tru?ng. ? Qu v? lnh v?t m? nhanh ??n m?c no. ? C?m gic thoa?i ma?i va? mong mu?n quan h? ti?nh du?c. H??ng d?n chung  Khng s?? du?ng nu?t g?c ho??c thu?t r??a cho ??n khi ???c chuyn gia ch?m so?c s??c kho?e ch?p thu?n.  M?c qu?n o r?ng ri, tho?i mi v o ng?c nng ?? v v?a v?n.  Gi? cho t?ng sinh mn c?a qu v? s?ch v kh. Lau t?? tr???c ra sau khi quy? vila?i v g?i cho chuyn gia ch?m so?c s??c kho?e ?? trao ??i. Khng x? c?c mu ?ng xu?ng b?n c?u tr??c khi qu v? c ch? d?n c?a chuyn gia ch?m [place] s?c kh?e.  Tun th? t?t c? cc l?n khm theo di cho quy? vi? va? con quy? vi? theo ch? d?n c?a chuyn gia ch?m [place] s?c kh?e. ?i?u ny c vai tr quan tr?ng. Hy lin l?c v?i chuyn gia ch?m [place] s?c kh?e n?u:  Qu v? bi?: ? S?t. ? Kh h? ? m ??o c mi kh ch?u. ? M? ho?c mi hi thot ra ? v?t m?. ? Kh ?i ti?u ho?c ?au khi ?i ti?u. ? ??t ng?t t?ng ho??c gia?m s? l?n ?a?i ti?n. ? Qu v? b? t?y ??, s?ng n?, ho?c ?au nhi?u h?n xung [place]  v?t m?. ? Qu v? c d?ch ho?c mu ch?y ra nhi?u h?n ? v?t m?. ? Pht ban. ? Bu?n nn. ? I?t quan tm ho??c [employer] tm ??n ca?c hoa?t ??ng ma? quy? vi? t??ng thi?ch thu?. ? Ca?c cu ho?i v? ch?m so?c ba?n thn ho?c ch?m [place] con quy? vi?.  C?m th?y ?m khi s? vo v?t m? c?a qu v?.  Vu? cu?a quy? vi? chuy?n sang mu ?? ho?c b? ?au ho?c c?ng.  Quy? vi? ca?m th?y bu?n ho??c lo l??ng b?t th???ng.  Qu v? nn.  Qu v? c c?c mu ?ng tri t? m ??o ra.  Qu  v? ?i ti?u nhi?u h?n bnh th??ng.  Quy? vi? b? chng m?t ho?c choa?ng va?ng. Yu c?u tr? gip ngay l?p t?c n?u:  Qu v? bi?: ? ?au ma? khng h?t ho?c khng ??? sau khi du?ng thu?c. ? ?au ng?c. ? Kh th?. ? Nhi?n m?? ho?c nhi?n th?y ??m. ? Y? nghi? v? vi?c la?m t?n th??ng ba?n thn ho?c con quy? vi?. ? Qu v? m?i b? ?au ? b?ng ho?c ? m?t trong hai chn. ? ?au ??u d? d?i.  Qu v? b? ng?t.  Quy? vi? bi? cha?y ma?u ?? m ?a?o nhi?u ??n m??c th?m ???t h?n m?t mi?ng b?ng v? sinh trong m?t gi??. Ch?y mu khng ???c n?ng h?n giai ?o?n n?ng nh?t c?a qu v?. Tm t?t  Sau khi lm th? thu?t, qu v? th??ng b? ?au ? v? tr v?t m?, co th?t b?ng v ch?y mu nh? ? m ??o.  Ki?m tra vng c v?t m? m?i ngy xem c cc d?u hi?u nhi?m trng hay khng.  Hy ni cho chuyn gia ch?m [place] s?c kh?e bi?t v? b?t c? tri?u ch?ng b?t th??ng no.  Tun th? t?t c? cc l?n khm theo di cho quy? vi? va? con quy? vi? theo ch? d?n c?a chuyn gia ch?m [place] s?c kh?e. Thng tin ny khng nh?m m?c ?ch thay th? cho l?i khuyn m chuyn gia ch?m [place] s?c kh?e ni v?i qu v?. Hy b?o ??m qu v? ph?i th?o lu?n b?t k? v?n ?? g m qu v? c v?i chuyn gia ch?m  s?c kh?e c?a qu v?. Document Released: 12/18/2015 Document Revised: 04/07/2018 Document Reviewed: 04/07/2018 Elsevier Patient Education  2020 [employer].

## 2019-06-11 NOTE — Progress Notes (Signed)
Raynelle Chary [phone] used to review discharge paper work

## 2019-06-24 ENCOUNTER — Ambulatory Visit: Payer: Medicaid Other

## 2019-07-19 ENCOUNTER — Encounter: Payer: Self-pay | Admitting: Obstetrics and Gynecology

## 2019-07-19 ENCOUNTER — Ambulatory Visit: Payer: Medicaid Other | Admitting: Obstetrics and Gynecology

## 2019-07-29 ENCOUNTER — Other Ambulatory Visit: Payer: Self-pay

## 2019-07-29 ENCOUNTER — Encounter: Payer: Self-pay | Admitting: Obstetrics and Gynecology

## 2019-07-29 ENCOUNTER — Other Ambulatory Visit (HOSPITAL_COMMUNITY)
Admission: RE | Admit: 2019-07-29 | Discharge: 2019-07-29 | Disposition: A | Discharge status: Home / Self Care | Payer: Medicaid Other | Source: Ambulatory Visit | Attending: Obstetrics and Gynecology | Admitting: Obstetrics and Gynecology

## 2019-07-29 ENCOUNTER — Ambulatory Visit (INDEPENDENT_AMBULATORY_CARE_PROVIDER_SITE_OTHER): Payer: Medicaid Other | Admitting: Obstetrics and Gynecology

## 2019-07-29 VITALS — BP 120/81 | HR 73 | Wt 158.9 lb

## 2019-07-29 DIAGNOSIS — Z1389 Encounter for screening for other disorder: Secondary | ICD-10-CM

## 2019-07-29 DIAGNOSIS — Z309 Encounter for contraceptive management, unspecified: Secondary | ICD-10-CM | POA: Insufficient documentation

## 2019-07-29 DIAGNOSIS — Z98891 History of uterine scar from previous surgery: Secondary | ICD-10-CM | POA: Insufficient documentation

## 2019-07-29 DIAGNOSIS — Z789 Other specified health status: Secondary | ICD-10-CM

## 2019-07-29 MED ORDER — SENNOSIDES-DOCUSATE SODIUM 8.6-50 MG PO TABS: 2.0000 | ORAL_TABLET | ORAL | 0 refills | Status: DC

## 2019-07-29 NOTE — Patient Instructions (Signed)
Health Maintenance, Female Adopting a healthy lifestyle and getting preventive care are important in promoting health and wellness. Ask your health care provider about:  The right schedule for you to have regular tests and exams.  Things you can do on your own to prevent diseases and keep yourself healthy. What should I know about diet, weight, and exercise? Eat a healthy diet   Eat a diet that includes plenty of vegetables, fruits, low-fat dairy products, and lean protein.  Do not eat a lot of foods that are high in solid fats, added sugars, or sodium. Maintain a healthy weight Body mass index (BMI) is used to identify weight problems. It estimates body fat based on height and weight. Your health care provider can help determine your BMI and help you achieve or maintain a healthy weight. Get regular exercise Get regular exercise. This is one of the most important things you can do for your health. Most adults should:  Exercise for at least 150 minutes each week. The exercise should increase your heart rate and make you sweat (moderate-intensity exercise).  Do strengthening exercises at least twice a week. This is in addition to the moderate-intensity exercise.  Spend less time sitting. Even light physical activity can be beneficial. Watch cholesterol and blood lipids Have your blood tested for lipids and cholesterol at 28 years of age, then have this test every 5 years. Have your cholesterol levels checked more often if:  Your lipid or cholesterol levels are high.  You are older than 28 years of age.  You are at high risk for heart disease. What should I know about cancer screening? Depending on your health history and family history, you may need to have cancer screening at various ages. This may include screening for:  Breast cancer.  Cervical cancer.  Colorectal cancer.  Skin cancer.  Lung cancer. What should I know about heart disease, diabetes, and high blood  pressure? Blood pressure and heart disease  High blood pressure causes heart disease and increases the risk of stroke. This is more likely to develop in people who have high blood pressure readings, are of African descent, or are overweight.  Have your blood pressure checked: ? Every 3-5 years if you are 18-39 years of age. ? Every year if you are 40 years old or older. Diabetes Have regular diabetes screenings. This checks your fasting blood sugar level. Have the screening done:  Once every three years after age 40 if you are at a normal weight and have a low risk for diabetes.  More often and at a younger age if you are overweight or have a high risk for diabetes. What should I know about preventing infection? Hepatitis B If you have a higher risk for hepatitis B, you should be screened for this virus. Talk with your health care provider to find out if you are at risk for hepatitis B infection. Hepatitis C Testing is recommended for:  Everyone born from 1945 through 1965.  Anyone with known risk factors for hepatitis C. Sexually transmitted infections (STIs)  Get screened for STIs, including gonorrhea and chlamydia, if: ? You are sexually active and are younger than 28 years of age. ? You are older than 28 years of age and your health care provider tells you that you are at risk for this type of infection. ? Your sexual activity has changed since you were last screened, and you are at increased risk for chlamydia or gonorrhea. Ask your health care provider if   you are at risk.  Ask your health care provider about whether you are at high risk for HIV. Your health care provider may recommend a prescription medicine to help prevent HIV infection. If you choose to take medicine to prevent HIV, you should first get tested for HIV. You should then be tested every 3 months for as long as you are taking the medicine. Pregnancy  If you are about to stop having your period (premenopausal) and  you may become pregnant, seek counseling before you get pregnant.  Take 400 to 800 micrograms (mcg) of folic acid every day if you become pregnant.  Ask for birth control (contraception) if you want to prevent pregnancy. Osteoporosis and menopause Osteoporosis is a disease in which the bones lose minerals and strength with aging. This can result in bone fractures. If you are 65 years old or older, or if you are at risk for osteoporosis and fractures, ask your health care provider if you should:  Be screened for bone loss.  Take a calcium or vitamin D supplement to lower your risk of fractures.  Be given hormone replacement therapy (HRT) to treat symptoms of menopause. Follow these instructions at home: Lifestyle  Do not use any products that contain nicotine or tobacco, such as cigarettes, e-cigarettes, and chewing tobacco. If you need help quitting, ask your health care provider.  Do not use street drugs.  Do not share needles.  Ask your health care provider for help if you need support or information about quitting drugs. Alcohol use  Do not drink alcohol if: ? Your health care provider tells you not to drink. ? You are pregnant, may be pregnant, or are planning to become pregnant.  If you drink alcohol: ? Limit how much you use to 0-1 drink a day. ? Limit intake if you are breastfeeding.  Be aware of how much alcohol is in your drink. In the [place] one drink equals one 12 oz bottle of beer (355 mL), one 5 oz glass of wine (148 mL), or one 1 oz glass of hard liquor (44 mL). General instructions  Schedule regular health, dental, and eye exams.  Stay current with your vaccines.  Tell your health care provider if: ? You often feel depressed. ? You have ever been abused or do not feel safe at home. Summary  Adopting a healthy lifestyle and getting preventive care are important in promoting health and wellness.  Follow your health care provider's instructions about healthy  diet, exercising, and getting tested or screened for diseases.  Follow your health care provider's instructions on monitoring your cholesterol and blood pressure. This information is not intended to replace advice given to you by your health care provider. Make sure you discuss any questions you have with your health care provider. Document Released: 03/11/2011 Document Revised: 08/19/2018 Document Reviewed: 08/19/2018 Elsevier Patient Education  2020 Elsevier Inc.  

## 2019-07-29 NOTE — Progress Notes (Signed)
Subjective:     Kelli Christian is a 28 y.o. female who presents for a postpartum visit. She is 7 weeks 1 day postpartum following a low cervical transverse Cesarean section. I have fully reviewed the prenatal and intrapartum course. The delivery was at 62 gestational weeks. Outcome: primary cesarean section, low transverse incision. Anesthesia: epidural. Postpartum course has been unremarkable. Baby's course has been unremarkable. Baby is feeding by both breast and bottle - Carnation Good Start. Bleeding no bleeding. Bowel function is abnormal: pt reports constipation; last BM this AM.. Bladder function is normal. Patient is sexually active. Contraception method is none. Postpartum depression screening: negative.  The following portions of the patient's history were reviewed and updated as appropriate: allergies, current medications, past family history, past medical history, past social history, past surgical history and problem list.  Review of Systems Pertinent items noted in HPI and remainder of comprehensive ROS otherwise negative.   Objective:    LMP 08/23/2018 (Approximate)   General:  alert   Breasts:  deferred  Lungs: clear to auscultation bilaterally  Heart:  regular rate and rhythm, S1, S2 normal, no murmur, click, rub or gallop  Abdomen: soft, non-tender; bowel sounds normal; no masses,  no organomegaly, incision well healed   Vulva:  normal  Vagina: normal vagina  Cervix:  no lesions  Corpus: normal size, contour, position, consistency, mobility, non-tender  Adnexa:  normal adnexa  Rectal Exam: Not performed.        Assessment:     NL postpartum exam. Pap smear done at today's visit.   Plan:    1. Contraception: condoms. Information on contraception provided to pt 2. Return to Nl ADL's as tolerates 3. Follow up in: 1 yr or as needed.

## 2019-08-02 LAB — CYTOLOGY - PAP: Diagnosis: NEGATIVE

## 2019-08-03 ENCOUNTER — Telehealth: Payer: Self-pay

## 2019-08-03 NOTE — Telephone Encounter (Addendum)
-----   Message from Chancy Milroy, MD sent at 08/03/2019  8:49 AM EST ----- Please let pt know that her pap smear was negative.  Thanks Jane Canary pt with [employer] [id number] and notified pt that her pap smear results are negative and that she can f/u with [place] with her gyn concerns.    Mel Almond, RN

## 2020-02-12 IMAGING — US US MFM OB FOLLOW UP
1 series · 12 of 28 positions shown · non-contrast
Comparison: none

[Series 1: us mfm ob follow up · 12 of 48 slices shown]
[im 2/48]
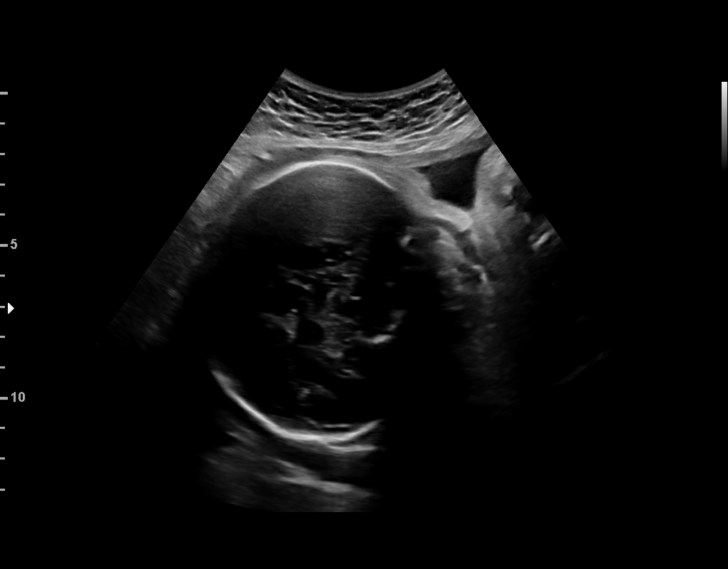
[im 6/48]
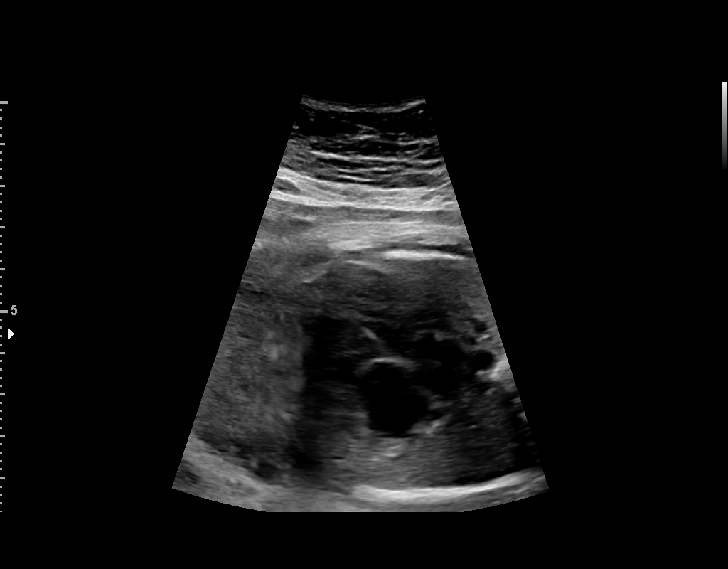
[im 9/48]
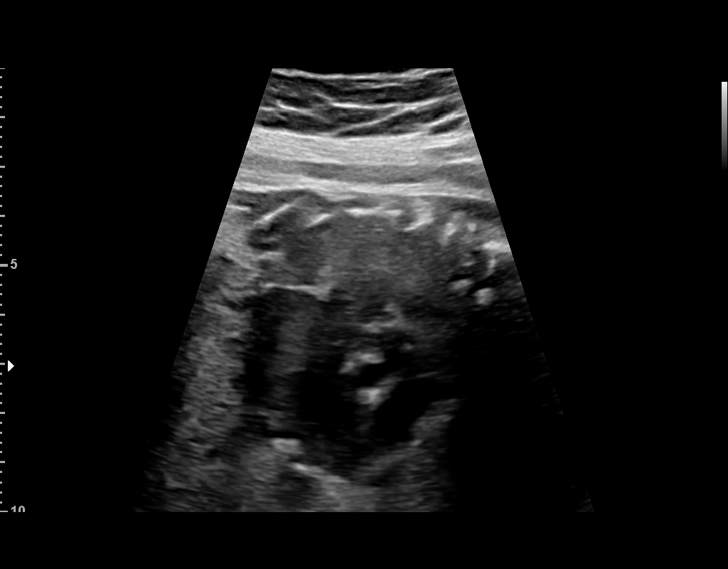
[im 14/48]
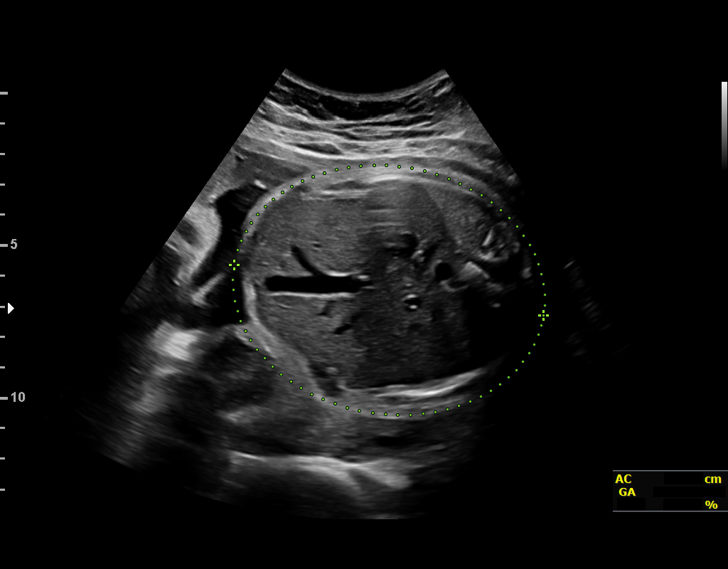
[im 18/48]
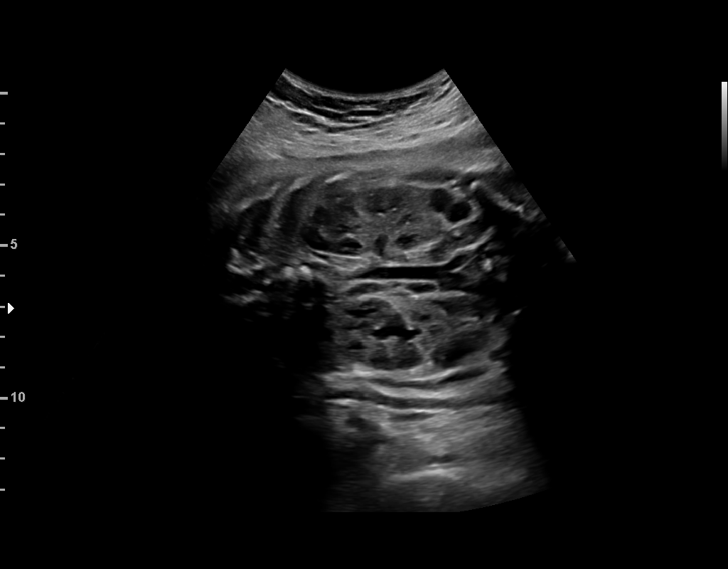
[im 21/48]
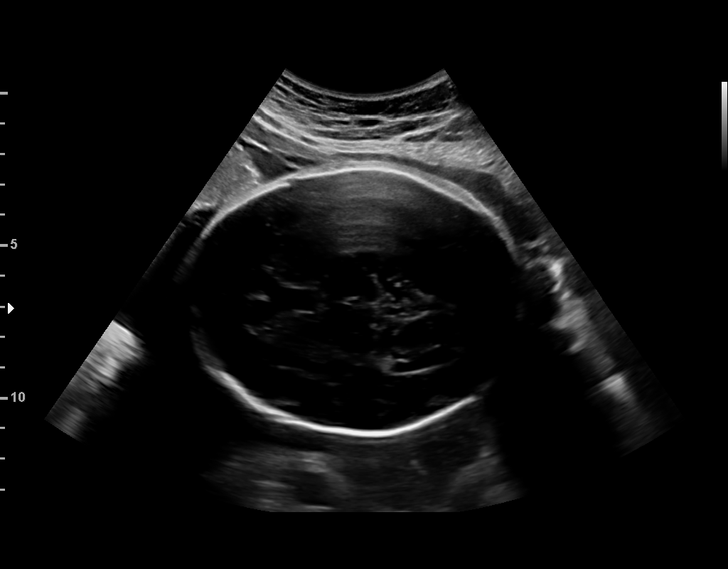
[im 27/48]
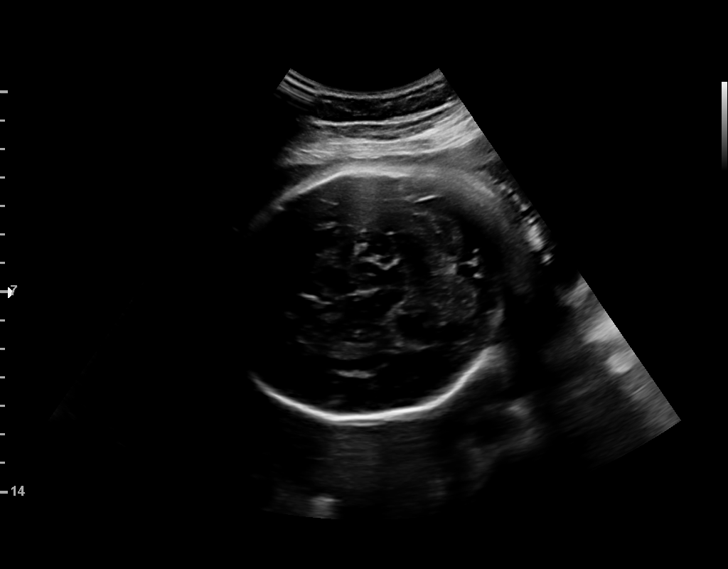
[im 30/48]
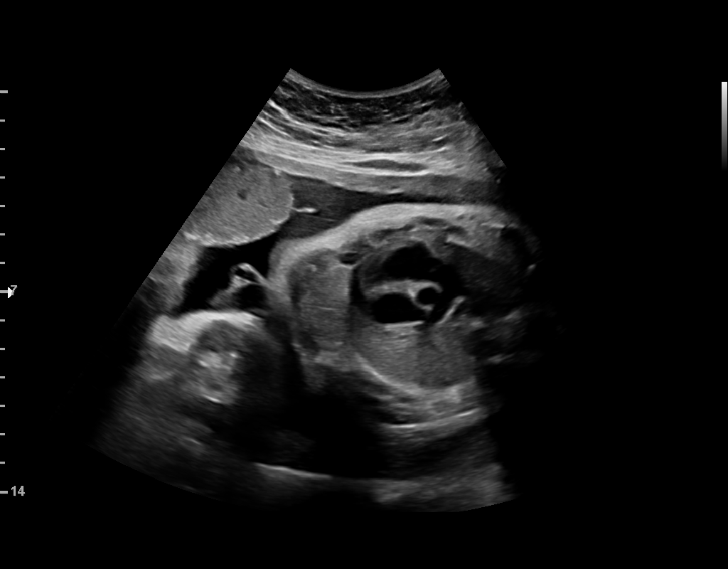
[im 34/48]
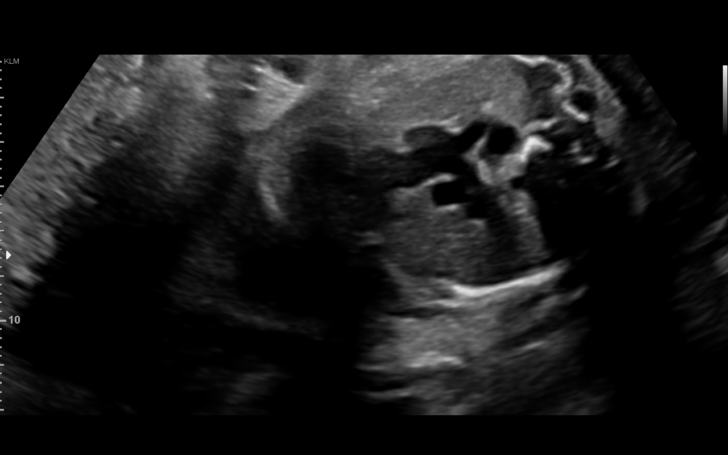
[im 39/48]
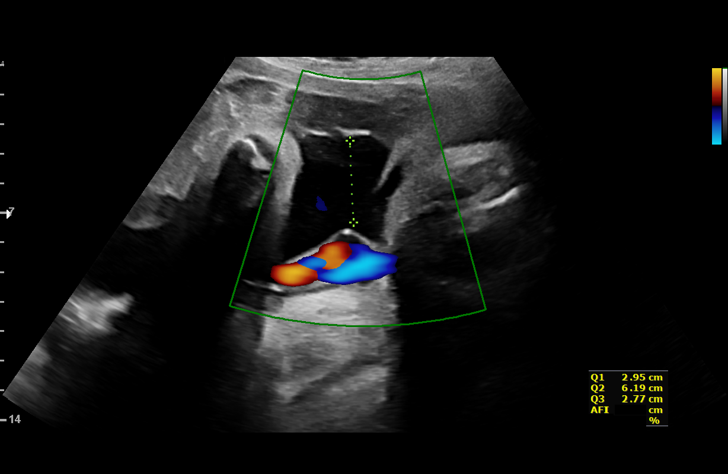
[im 42/48]
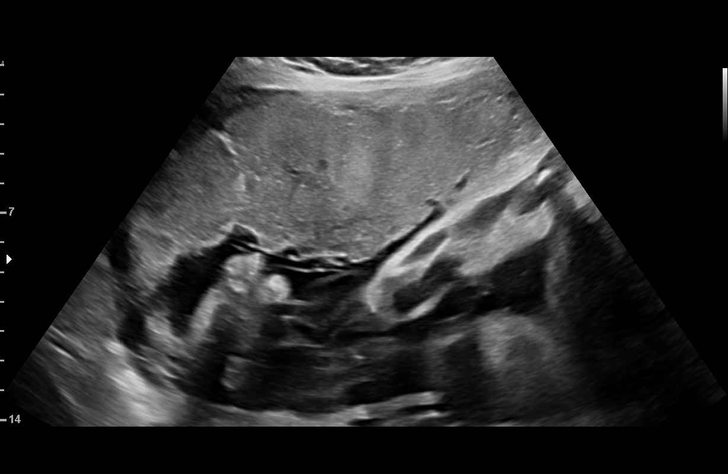
[im 46/48]
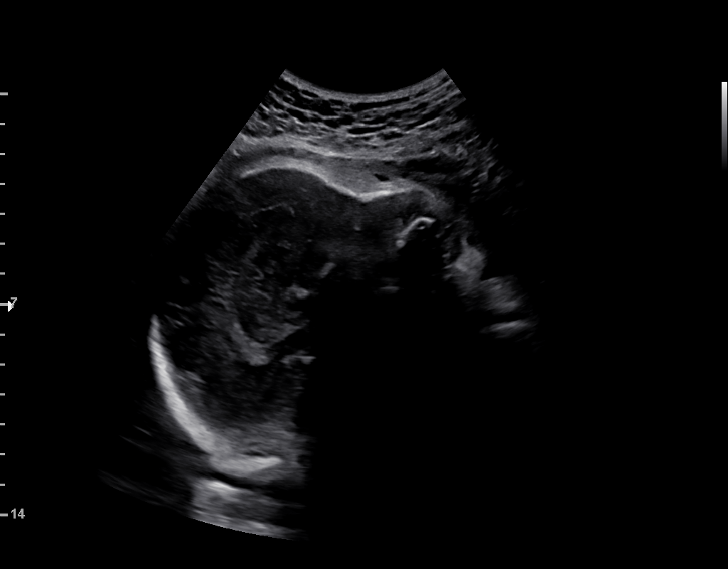

[12 of 28 positions shown; findings below may reference images not displayed]

[HOSPITAL][HOSPITAL]
                   [REDACTED] [REDACTED]

                                                       [facility]
 ----------------------------------------------------------------------

 ----------------------------------------------------------------------
Indications

  Antenatal follow-up for nonvisualized fetal
  anatomy
  32 weeks gestation of pregnancy
 ----------------------------------------------------------------------
Fetal Evaluation

 Num Of Fetuses:         1
 Fetal Heart Rate(bpm):  153
 Cardiac Activity:       Observed
 Presentation:           Breech
 Placenta:               Anterior
 P. Cord Insertion:      Previously Visualized

 Amniotic Fluid
 AFI FV:      Within normal limits

 AFI Sum(cm)     %Tile       Largest Pocket(cm)
 14.66           51

 RUQ(cm)       RLQ(cm)       LUQ(cm)        LLQ(cm)

Biometry

 BPD:      86.1  mm     G. Age:  34w 5d         96  %    CI:        72.47   %    70 - 86
                                                         FL/HC:      18.8   %    19.1 -
 HC:      321.7  mm     G. Age:  36w 2d         98  %    HC/AC:      1.08        0.96 -
 AC:      296.9  mm     G. Age:  33w 5d         88  %    FL/BPD:     70.4   %    71 - 87
 FL:       60.6  mm     G. Age:  31w 4d         21  %    FL/AC:      20.4   %    20 - 24
 Est. FW:    1324  gm    4 lb 13 oz      79  %
OB History

 Gravidity:    3         Term:   2        Prem:   0        SAB:   0
 TOP:          0       Ectopic:  0        Living: 2
Gestational Age

 LMP:           34w 2d        Date:  08/23/18                 EDD:   05/30/19
 U/S Today:     34w 1d                                        EDD:   05/31/19
 Best:          32w 1d     Det. By:  Early Ultrasound         EDD:   06/14/19
                                     (03/04/19)
Anatomy

 Cranium:               Appears normal         Aortic Arch:            Appears normal
 Cavum:                 Appears normal         Ductal Arch:            Appears normal
 Ventricles:            Appears normal         Diaphragm:              Appears normal
 Choroid Plexus:        Appears normal         Stomach:                Appears normal, left
                                                                       sided
 Cerebellum:            Appears normal         Abdomen:                Appears normal
 Posterior Fossa:       Appears normal         Abdominal Wall:         Not well visualized
 Nuchal Fold:           Not applicable (>20    Cord Vessels:           Appears normal (3
                        wks GA)                                        vessel cord)
 Face:                  Appears normal         Kidneys:                Appear normal
                        (orbits and profile)
 Lips:                  Appears normal         Bladder:                Appears normal
 Thoracic:              Appears normal         Spine:                  Appears normal
 Heart:                 Appears normal         Upper Extremities:      Visualized
                        (4CH, axis, and
                        situs)
 RVOT:                  Appears normal         Lower Extremities:      Visualized
 LVOT:                  Appears normal

 Other:  Fetus appears to be a male. 3VV and 3VTV visualized. Technically
         difficult due to advanced gestational age.
Cervix Uterus Adnexa

 Cervix
 Not visualized (advanced GA >26wks)
Impression

 Normal interval growth.
Recommendations

 Follow up growth as clinically indicated.

## 2021-09-09 NOTE — L&D Delivery Note (Addendum)
LABOR COURSE Patient admitted for IOL of oligo with GDMA2 and was a TOLAC. Patient received FB, pitocin, and was AROMed.  Delivery Note Called to room and patient was complete and pushing. Head delivered. No nuchal cord present. Shoulder and body delivered in usual fashion. At  1147 a viable and healthy female was delivered via Vaginal, Spontaneous (Presentation: ROA).  Infant with spontaneous cry, placed on mother's abdomen, dried and stimulated. Cord clamped x 2 after 1-minute delay, and cut by MD. Cord blood drawn. Placenta delivered spontaneously with gentle cord traction. Appears intact. Fundus firm with massage and Pitocin. Labia, perineum, vagina, and cervix inspected with 2nd degree perineal laceration that was repaired.    APGAR: 8, 9; weight pending.   Cord: 3VC with the following complications: none.   Cord pH: not drawn  Anesthesia:  Epidural Episiotomy: None Lacerations: 2nd degree perineal laceration, hx of episiotomy Suture Repair: 3.0 vicryl Est. Blood Loss (mL): 525 ml  Mom to postpartum.  Baby to Couplet care / Skin to Skin.  Lesly Dukes, MD PGY-1 08/22/22 12:32 PM    GME ATTESTATION:  I saw and evaluated the patient. I agree with the findings and the plan of care as documented in the resident's note. I have made changes to documentation as necessary.  Lavonda Jumbo, DO OB Fellow, Faculty [facility] 08/22/2022, 12:51 PM

## 2022-01-31 ENCOUNTER — Ambulatory Visit (INDEPENDENT_AMBULATORY_CARE_PROVIDER_SITE_OTHER): Payer: Medicaid Other

## 2022-01-31 DIAGNOSIS — Z3201 Encounter for pregnancy test, result positive: Secondary | ICD-10-CM

## 2022-01-31 LAB — POCT PREGNANCY, URINE: Preg Test, Ur: POSITIVE — AB

## 2022-01-31 NOTE — Progress Notes (Signed)
Patient dropped off urine today for pregnancy test. Urine pregnancy test positive. I attempted to call patient to review these results with her. Patient husband answered and requested I call back tomorrow to reach patient.   I attempted to call patient the following day on 02/01/22 but patient did not answer and voicemail not set up.  I called patient on 02/05/22 and was able to reach her. With the help of [employer] interpreter Emogene Morgan ID [id number] I reviewed positive pregnancy test with patient. Per patient she had a positive pregnancy test at home at the beginning of May. Patient denies any pain or vaginal bleeding. Per patient her LMP was 11/13/21 making her [redacted]w[redacted]d today with an EDD of 08/20/22. Patient requested to receive prenatal care here at the [facility]. New OB appointment scheduled. I reviewed appointment date and time with patient. I recommended patient begin taking prenatal vitamins. Patient requested a prescription be sent into her pharmacy for prenatal vitamins as she does not feel confident that she would be able to find the prenatal vitamins over the counter with language barrier. Prescription for prenatal vitamins sent to patient's pharmacy. Patient verbalized understanding and denies any other questions.  Paulina Fusi, RN 01/31/22

## 2022-02-05 MED ORDER — PRENATAL VITAMIN 27-0.8 MG PO TABS: 1.0000 | ORAL_TABLET | Freq: Every day | ORAL | 11 refills | Status: DC

## 2022-02-12 ENCOUNTER — Other Ambulatory Visit (HOSPITAL_COMMUNITY)
Admission: RE | Admit: 2022-02-12 | Discharge: 2022-02-12 | Disposition: A | Discharge status: Home / Self Care | Payer: Medicaid Other | Source: Ambulatory Visit | Attending: Family Medicine | Admitting: Family Medicine

## 2022-02-12 ENCOUNTER — Ambulatory Visit (INDEPENDENT_AMBULATORY_CARE_PROVIDER_SITE_OTHER): Payer: Medicaid Other

## 2022-02-12 DIAGNOSIS — Z3491 Encounter for supervision of normal pregnancy, unspecified, first trimester: Secondary | ICD-10-CM

## 2022-02-12 DIAGNOSIS — Z3481 Encounter for supervision of other normal pregnancy, first trimester: Secondary | ICD-10-CM | POA: Diagnosis not present

## 2022-02-12 DIAGNOSIS — Z31430 Encounter of female for testing for genetic disease carrier status for procreative management: Secondary | ICD-10-CM | POA: Diagnosis not present

## 2022-02-12 DIAGNOSIS — Z3490 Encounter for supervision of normal pregnancy, unspecified, unspecified trimester: Secondary | ICD-10-CM | POA: Insufficient documentation

## 2022-02-12 MED ORDER — GOJJI WEIGHT SCALE MISC: 1.0000 | 0 refills | Status: DC | PRN

## 2022-02-12 MED ORDER — BLOOD PRESSURE KIT DEVI: 1.0000 | 0 refills | Status: DC | PRN

## 2022-02-12 NOTE — Progress Notes (Signed)
New OB Intake  Patient came in person for her New OB Intake. -[employer] interpreter assist with the call for language Estanislado Spire by telephone call, but then call dropped and patient stated she can speak [place] Used [place] [employer] interpreter assist with the rest of the visit.   I discussed the limitations, risks, security and privacy concerns of performing an evaluation and management service by telephone and the availability of in person appointments. I also discussed with the patient that there may be a patient responsible charge related to this service. The patient expressed understanding and agreed to proceed.  I explained I am completing New OB Intake today. We discussed her EDD of 08/20/2022 that is based on LMP of 11/13/2021. Pt is G4/P3 . I reviewed her allergies, medications, Medical/Surgical/OB history, and appropriate screenings. I informed her of [facility] services. Based on history, this is an  pregnancy uncomplicated .  Patient Active Problem List   Diagnosis Date Noted   Supervision of low-risk pregnancy 02/12/2022   Language barrier affecting health care 03/05/2016     Concerns addressed today: -Rescheduled patient initial OB visit due to patient requesting morning time. -Scheduled anatomy ultrasound. -Hx of C-Section last pregnancy due baby being breech.    Delivery Plans:  Plans to deliver at [facility].   MyChart/Babyscripts MyChart access verified. I explained pt will have some visits in office and some virtually. Babyscripts instructions given and order placed. Patient verifies receipt of registration text/e-mail. Account successfully created and app downloaded.  Blood Pressure Cuff  Blood pressure cuff ordered for patient to pick-up from [employer]. Explained after first prenatal appt pt will check weekly and document in Babyscripts.  Weight scale: Patient does not  have weight scale. Weight scale ordered for patient to pick up from [employer].   Anatomy  US Explained first scheduled [place] will be around 19 weeks. Anatomy US scheduled for 03/26/2022 at 09:30AM. Pt notified to arrive at 09:15AM.  Labs Discussed [place]atera genetic screening with patient. Would like both Panorama and Horizon drawn at new OB visit.Also if interested in genetic testing, tell patient she will need AFP 15-21 weeks to complete genetic testing .Routine prenatal labs needed.  Covid Vaccine Patient has covid vaccine.   Is patient a CenteringPregnancy candidate?  Not a candidate due to Language   Is patient a Mom+Baby Combined Care candidate?  Not a candidate    Is patient interested in [place]?  No   Informed patient of [facility] website  and placed link in her AVS.   Social Determinants of Health Food Insecurity: Patient denies food insecurity. WIC Referral: Patient is interested in referral to [facility].  Transportation: Patient denies transportation needs. Childcare: Discussed no children allowed at ultrasound appointments. Offered childcare services; patient declines childcare services at this time.  Send link to Pregnancy Navigators   Placed OB Box on problem list and updated  First visit review I reviewed new OB appt with pt. I explained she will have a pelvic exam, ob bloodwork with genetic screening, and PAP smear. Explained pt will be seen by Milas Hock MD at first visit; encounter routed to appropriate provider. Explained that patient will be seen by pregnancy navigator following visit with provider. [facility] information placed in AVS.   Vidal Schwalbe, CMA 02/12/2022  1:15 PM

## 2022-02-13 ENCOUNTER — Encounter: Payer: Self-pay | Admitting: *Deleted

## 2022-02-13 LAB — CBC/D/PLT+RPR+RH+ABO+RUBIGG...
Antibody Screen: NEGATIVE
Basophils Absolute: 0.1 10*3/uL (ref 0.0–0.2)
Basos: 1 %
EOS (ABSOLUTE): 0.4 10*3/uL (ref 0.0–0.4)
Eos: 4 %
HCV Ab: NONREACTIVE
HIV Screen 4th Generation wRfx: NONREACTIVE
Hematocrit: 32.1 % — ABNORMAL LOW (ref 34.0–46.6)
Hemoglobin: 10.4 g/dL — ABNORMAL LOW (ref 11.1–15.9)
Hepatitis B Surface Ag: NEGATIVE
Immature Grans (Abs): 0 10*3/uL (ref 0.0–0.1)
Immature Granulocytes: 0 %
Lymphocytes Absolute: 1.9 10*3/uL (ref 0.7–3.1)
Lymphs: 20 %
MCH: 22.8 pg — ABNORMAL LOW (ref 26.6–33.0)
MCHC: 32.4 g/dL (ref 31.5–35.7)
MCV: 70 fL — ABNORMAL LOW (ref 79–97)
Monocytes Absolute: 0.7 10*3/uL (ref 0.1–0.9)
Monocytes: 7 %
Neutrophils Absolute: 6.2 10*3/uL (ref 1.4–7.0)
Neutrophils: 68 %
Platelets: 295 10*3/uL (ref 150–450)
RBC: 4.56 x10E6/uL (ref 3.77–5.28)
RDW: 15 % (ref 11.7–15.4)
RPR Ser Ql: NONREACTIVE
Rh Factor: POSITIVE
Rubella Antibodies, IGG: 11.1 index (ref >0.99)
WBC: 9.3 10*3/uL (ref 3.4–10.8)

## 2022-02-13 LAB — HEMOGLOBIN A1C
Est. average glucose Bld gHb Est-mCnc: 85 mg/dL
Hgb A1c MFr Bld: 4.6 % — ABNORMAL LOW (ref 4.8–5.6)

## 2022-02-13 LAB — GC/CHLAMYDIA PROBE AMP (~~LOC~~) NOT AT ARMC
Chlamydia: NEGATIVE
Comment: NEGATIVE
Comment: NORMAL
Neisseria Gonorrhea: NEGATIVE

## 2022-02-13 LAB — HCV INTERPRETATION

## 2022-02-14 LAB — URINE CULTURE, OB REFLEX: Organism ID, Bacteria: NO GROWTH

## 2022-02-14 LAB — CULTURE, OB URINE

## 2022-02-22 ENCOUNTER — Telehealth: Payer: Self-pay | Admitting: *Deleted

## 2022-02-22 ENCOUNTER — Encounter: Payer: Self-pay | Admitting: *Deleted

## 2022-02-22 NOTE — Telephone Encounter (Signed)
Patient called afterhours nurse line 02/19/22 c/o severe lower abd pain  and is pregnant. Was advised to go to ED. Per chart review do not see that patient went to ED within [facility]. I called [employer] and no [place] interpreter available. Nancy Fetter

## 2022-02-25 NOTE — Telephone Encounter (Signed)
Left message that this is our second attempt in trying to reach you if she continues to have questions or concerns to please give the office a call and please follow the recommendation of going to the MAU.   Leonette Nutting  02/25/22

## 2022-03-06 ENCOUNTER — Encounter: Payer: Medicaid Other | Admitting: Family Medicine

## 2022-03-07 ENCOUNTER — Telehealth: Payer: Self-pay

## 2022-03-07 NOTE — Telephone Encounter (Signed)
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 Called pt with [employer] interpreter Tawny Hopping. Results given and recommended partner testing. Pt would like to pick up kit at next appointment due to transportation issues. Reviewed upcoming appts.

## 2022-03-23 DIAGNOSIS — Z148 Genetic carrier of other disease: Secondary | ICD-10-CM | POA: Insufficient documentation

## 2022-03-23 DIAGNOSIS — D563 Thalassemia minor: Secondary | ICD-10-CM | POA: Insufficient documentation

## 2022-03-23 NOTE — Progress Notes (Unsigned)
History:   Kelli Christian is a 31 y.o. [id number] at 60w4dby LMP being seen today for her first obstetrical visit.   Patient {does/does not:19097} intend to breast feed.   Pregnancy history fully reviewed. Obstetrical history is significant for:  G1 VAVD G2 SVD G3 1LTCS - failed ECV for breech and went for c-section  Patient reports {sx:14538}.      HISTORY: OB History  Gravida Para Term Preterm AB Living  4 3 3  0 0 3  SAB IAB Ectopic Multiple Live Births  0 0 0 0 3    # Outcome Date GA Lbr Len/2nd Weight Sex Delivery Anes PTL Lv  4 Current           3 Term 06/09/19 375w2d8 lb 0.6 oz (3.645 kg) M  EPI  LIV     Birth Comments: ~1.5 cm superficial laceration to the left flank, bleeding easily controlled with brief pressure     Name: Mccrae,BOY Margarethe     Apgar1: 8  Apgar5: 9  2 Term 06/21/17 3978w5dF Vag-Spont EPI N LIV  1 Term 03/17/16 39w81w3d35 / 02:31 6 lb 14.6 oz (3.135 kg) F VBAC, Vacuum EPI  LIV     Birth Comments: Newborn Screen MR# [id number] # [id number]: normal Galactosemia: normal Thyroid: normal Biotinidase: normal Hgb: Hgb E trait CF: normal Amino acid profile: normal Acylcarnitine prfile: normal     Name: Renovato,GIRL Lavoris     Apgar1: 9  Apgar5: 9     Last pap smear was done 07/2019 and was normal Lab Results  Component Value Date   DIAGPAP  07/29/2019    - Negative for intraepithelial lesion or malignancy (NILM)     Past Medical History:  Diagnosis Date   Vaginal Pap smear, abnormal    Past Surgical History:  Procedure Laterality Date   CESAREAN SECTION N/A 06/09/2019   Procedure: CESAREAN SECTION;  Surgeon: ErviChancy Milroy;  Location: [facility];  Service: Obstetrics;  Laterality: N/A;   LEEP     Family History  Problem Relation Age of Onset   Alcohol abuse Neg Hx    Arthritis Neg Hx    Asthma Neg Hx    Birth defects Neg Hx    Cancer Neg Hx    COPD Neg Hx    Depression Neg Hx    Diabetes Neg Hx    Drug abuse Neg Hx     Early death Neg Hx    Hearing loss Neg Hx    Heart disease Neg Hx    Hyperlipidemia Neg Hx    Hypertension Neg Hx    Kidney disease Neg Hx    Learning disabilities Neg Hx    Mental illness Neg Hx    Mental retardation Neg Hx    Miscarriages / Stillbirths Neg Hx    Stroke Neg Hx    Vision loss Neg Hx    Varicose Veins Neg Hx    Social History   Tobacco Use   Smoking status: Never   Smokeless tobacco: Never  Vaping Use   Vaping Use: Never used  Substance Use Topics   Alcohol use: No   Drug use: No   No Known Allergies Current Outpatient Medications on File Prior to Visit  Medication Sig Dispense Refill   Blood Pressure Monitoring (BLOOD PRESSURE KIT) DEVI 1 Device by Does not apply route as needed. 1 each 0   ibuprofen (ADVIL) 800 MG tablet Take 1  tablet (800 mg total) by mouth every 8 (eight) hours as needed. 60 tablet 1   Misc. Devices (GOJJI WEIGHT SCALE) MISC 1 Device by Does not apply route as needed. 1 each 0   Prenat-FeFum-FePo-FA-Omega 3 (VIRT-C DHA) 53.5-38-1 MG CAPS Take 1 capsule by mouth daily.     Prenatal Vit-Fe Fumarate-FA (PRENATAL VITAMIN) 27-0.8 MG TABS Take 1 tablet by mouth daily. 30 tablet 11   senna-docusate (SENOKOT-S) 8.6-50 MG tablet Take 2 tablets by mouth daily. (Patient not taking: Reported on 02/05/2022) 20 tablet 0   No current facility-administered medications on file prior to visit.    Review of Systems Pertinent items noted in HPI and remainder of comprehensive ROS otherwise negative.  Physical Exam:  There were no vitals filed for this visit.   ***Bedside Ultrasound for FHR check: Viable intrauterine pregnancy with positive cardiac activity noted, fetal heart rate ***bpm  Patient informed that the ultrasound is considered a limited obstetric ultrasound and is not intended to be a complete ultrasound exam.  Patient also informed that the ultrasound is not being completed with the intent of assessing for fetal or placental anomalies or any  pelvic abnormalities.  Explained that the purpose of today's ultrasound is to assess for fetal heart rate.  Patient acknowledges the purpose of the exam and the limitations of the study.  General: well-developed, well-nourished female in no acute distress  Breasts:  normal appearance, no masses or tenderness bilaterally  Skin: normal coloration and turgor, no rashes  Neurologic: oriented, normal, negative, normal mood  Extremities: normal strength, tone, and muscle mass, ROM of all joints is normal  HEENT PERRLA, extraocular movement intact and sclera clear, anicteric  Neck supple and no masses  Cardiovascular: regular rate and rhythm  Respiratory:  no respiratory distress, normal breath sounds  Abdomen: soft, non-tender; bowel sounds normal; no masses,  no organomegaly  Pelvic: normal external genitalia, no lesions, normal vaginal mucosa, normal vaginal discharge, normal cervix, ***pap smear done. 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 Language barrier affecting health care Interpreter used throughout  2. Encounter for supervision of low-risk pregnancy in second trimester Pap with HPV done. GC/CT already done.  Initial labs done and wnl. Rh positive.  Continue prenatal vitamins. Problem list reviewed and updated. Genetic Screening discussed, NIPS: done and wnl. Ultrasound discussed; fetal anatomic survey: scheduled for 7/18. Anticipatory guidance about prenatal visits given including labs, ultrasounds, and testing. Discussed usage of Babyscripts and virtual visits  3. 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 Alpha thalassemia trait Kit given Has anemia - will  check for underlying iron deficiency as well with anemia panel  5. History of C-section Will plan to sign TOLAC consent closer to 28w   Initial labs drawn. Continue prenatal vitamins. Problem list reviewed and updated. Genetic Screening discussed, NIPS:  done and wnl . Ultrasound discussed; fetal anatomic survey:  scheduled for 7/18 . Anticipatory guidance about prenatal visits given including labs, ultrasounds, and testing. Discussed usage of Babyscripts and virtual visits  The nature of [place] [facility] Healthcare/Faculty Practice with multiple MDs and Advanced Practice Providers was explained to patient; also  emphasized that residents, students are part of our team. Routine obstetric precautions reviewed. Encouraged to seek out care at office or emergency room [facility] preferred) for urgent and/or emergent concerns. No follow-ups on file.    Radene Gunning, MD, [place] for [facility] [place]

## 2022-03-26 ENCOUNTER — Other Ambulatory Visit: Payer: Self-pay | Admitting: *Deleted

## 2022-03-26 ENCOUNTER — Other Ambulatory Visit: Payer: Self-pay | Admitting: Family Medicine

## 2022-03-26 ENCOUNTER — Ambulatory Visit: Payer: Medicaid Other | Attending: Family Medicine

## 2022-03-26 DIAGNOSIS — Z362 Encounter for other antenatal screening follow-up: Secondary | ICD-10-CM

## 2022-03-26 DIAGNOSIS — Z3A16 16 weeks gestation of pregnancy: Secondary | ICD-10-CM | POA: Insufficient documentation

## 2022-03-26 DIAGNOSIS — O359XX0 Maternal care for (suspected) fetal abnormality and damage, unspecified, not applicable or unspecified: Secondary | ICD-10-CM | POA: Insufficient documentation

## 2022-03-26 DIAGNOSIS — O34219 Maternal care for unspecified type scar from previous cesarean delivery: Secondary | ICD-10-CM | POA: Diagnosis not present

## 2022-03-26 DIAGNOSIS — O321XX0 Maternal care for breech presentation, not applicable or unspecified: Secondary | ICD-10-CM | POA: Insufficient documentation

## 2022-03-26 DIAGNOSIS — Z368A Encounter for antenatal screening for other genetic defects: Secondary | ICD-10-CM | POA: Diagnosis not present

## 2022-03-26 DIAGNOSIS — Z3491 Encounter for supervision of normal pregnancy, unspecified, first trimester: Secondary | ICD-10-CM | POA: Diagnosis not present

## 2022-03-26 DIAGNOSIS — Z363 Encounter for antenatal screening for malformations: Secondary | ICD-10-CM

## 2022-03-26 DIAGNOSIS — O283 Abnormal ultrasonic finding on antenatal screening of mother: Secondary | ICD-10-CM

## 2022-03-27 ENCOUNTER — Encounter: Payer: Self-pay | Admitting: Obstetrics and Gynecology

## 2022-03-27 ENCOUNTER — Other Ambulatory Visit (HOSPITAL_COMMUNITY)
Admission: RE | Admit: 2022-03-27 | Discharge: 2022-03-27 | Disposition: A | Discharge status: Home / Self Care | Payer: Medicaid Other | Source: Ambulatory Visit | Attending: Obstetrics and Gynecology | Admitting: Obstetrics and Gynecology

## 2022-03-27 ENCOUNTER — Ambulatory Visit (INDEPENDENT_AMBULATORY_CARE_PROVIDER_SITE_OTHER): Payer: Medicaid Other | Admitting: Obstetrics and Gynecology

## 2022-03-27 ENCOUNTER — Other Ambulatory Visit: Payer: Self-pay

## 2022-03-27 VITALS — BP 112/80 | HR 83 | Wt 144.0 lb

## 2022-03-27 DIAGNOSIS — Z789 Other specified health status: Secondary | ICD-10-CM

## 2022-03-27 DIAGNOSIS — D563 Thalassemia minor: Secondary | ICD-10-CM

## 2022-03-27 DIAGNOSIS — Z124 Encounter for screening for malignant neoplasm of cervix: Secondary | ICD-10-CM

## 2022-03-27 DIAGNOSIS — Z98891 History of uterine scar from previous surgery: Secondary | ICD-10-CM

## 2022-03-27 DIAGNOSIS — Z148 Genetic carrier of other disease: Secondary | ICD-10-CM

## 2022-03-27 DIAGNOSIS — Z3492 Encounter for supervision of normal pregnancy, unspecified, second trimester: Secondary | ICD-10-CM | POA: Diagnosis not present

## 2022-03-27 DIAGNOSIS — Z3201 Encounter for pregnancy test, result positive: Secondary | ICD-10-CM

## 2022-03-27 DIAGNOSIS — Z603 Acculturation difficulty: Secondary | ICD-10-CM

## 2022-03-27 DIAGNOSIS — Z3A18 18 weeks gestation of pregnancy: Secondary | ICD-10-CM

## 2022-03-27 MED ORDER — PRENATAL VITAMIN 27-0.8 MG PO TABS: 1.0000 | ORAL_TABLET | Freq: Every day | ORAL | 11 refills | Status: DC

## 2022-03-27 NOTE — Patient Instructions (Signed)

## 2022-03-29 LAB — IRON AND TIBC
Iron Saturation: 23 % (ref 15–55)
Iron: 101 ug/dL (ref 27–159)
Total Iron Binding Capacity: 433 ug/dL (ref 250–450)
UIBC: 332 ug/dL (ref 131–425)

## 2022-03-29 LAB — AFP, SERUM, OPEN SPINA BIFIDA
AFP MoM: 1.63
AFP Value: 52.7 ng/mL
Gest. Age on Collection Date: 17 weeks
Maternal Age At EDD: 31.9 yr
OSBR Risk 1 IN: 580
Test Results:: NEGATIVE
Weight: 144 [lb_av]

## 2022-03-29 LAB — FERRITIN: Ferritin: 66 ng/mL (ref 15–150)

## 2022-03-29 LAB — CYTOLOGY - PAP
Comment: NEGATIVE
Diagnosis: NEGATIVE
High risk HPV: NEGATIVE

## 2022-03-29 LAB — FOLATE: Folate: >20 ng/mL (ref >3.0)

## 2022-04-26 ENCOUNTER — Ambulatory Visit (INDEPENDENT_AMBULATORY_CARE_PROVIDER_SITE_OTHER): Payer: Medicaid Other | Admitting: Certified Nurse Midwife

## 2022-04-26 ENCOUNTER — Ambulatory Visit: Payer: Medicaid Other | Admitting: *Deleted

## 2022-04-26 ENCOUNTER — Ambulatory Visit: Payer: Medicaid Other | Attending: Maternal & Fetal Medicine

## 2022-04-26 VITALS — Wt 150.0 lb

## 2022-04-26 VITALS — BP 115/76 | HR 79

## 2022-04-26 DIAGNOSIS — Z789 Other specified health status: Secondary | ICD-10-CM

## 2022-04-26 DIAGNOSIS — O26892 Other specified pregnancy related conditions, second trimester: Secondary | ICD-10-CM

## 2022-04-26 DIAGNOSIS — O283 Abnormal ultrasonic finding on antenatal screening of mother: Secondary | ICD-10-CM | POA: Diagnosis not present

## 2022-04-26 DIAGNOSIS — O35BXX0 Maternal care for other (suspected) fetal abnormality and damage, fetal cardiac anomalies, not applicable or unspecified: Secondary | ICD-10-CM | POA: Diagnosis not present

## 2022-04-26 DIAGNOSIS — Z3A21 21 weeks gestation of pregnancy: Secondary | ICD-10-CM | POA: Diagnosis not present

## 2022-04-26 DIAGNOSIS — Z362 Encounter for other antenatal screening follow-up: Secondary | ICD-10-CM | POA: Diagnosis not present

## 2022-04-26 DIAGNOSIS — R519 Headache, unspecified: Secondary | ICD-10-CM

## 2022-04-26 DIAGNOSIS — Z3492 Encounter for supervision of normal pregnancy, unspecified, second trimester: Secondary | ICD-10-CM

## 2022-04-26 NOTE — Progress Notes (Signed)
Patient stated that she ran out of prenatal vitamins. I informed pt that she has 11 refills and she can pick them up at confirmed pharmacy

## 2022-04-26 NOTE — Progress Notes (Signed)
   PRENATAL VISIT NOTE  Subjective:  Kelli Christian is a 31 y.o. G4P3003 at [redacted]w[redacted]d being seen today for ongoing prenatal care.  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  Patient reports headache and fatigue  Patient states she work long hours and usually gets them at the end of the day. Reports that headaches improved with rest and relaxation. Patient states she "eats too much" but denies fasting, or lack of eating.   Contractions: Not present. Vag. Bleeding: None.  Movement: Present. Denies leaking of fluid.   The following portions of the patient's history were reviewed and updated as appropriate: allergies, current medications, past family history, past medical history, past social history, past surgical history and problem list.   Objective:   Vitals:   04/26/22 1029  Weight: 150 lb (68 kg)    Fetal Status: Fetal Heart Rate (bpm): 147   Movement: Present     General:  Alert, oriented and cooperative. Patient is in no acute distress.  Skin: Skin is warm and dry. No rash noted.   Cardiovascular: Normal heart rate noted  Respiratory: Normal respiratory effort, no problems with respiration noted  Abdomen: Soft, gravid, appropriate for gestational age.  Pain/Pressure: Absent     Pelvic: Cervical exam deferred        Extremities: Normal range of motion.     Mental Status: Normal mood and affect. Normal behavior. Normal judgment and thought content.   Assessment and Plan:  Pregnancy: G4P3003 at [redacted]w[redacted]d 1. Encounter for supervision of low-risk pregnancy in second trimester - Patient feeling frequent and vigorous fetal movement.  - Ultrasound scheduled for today.   2. Language barrier affecting health care - In Person interpreter used throughout visit today    3. [redacted] weeks gestation of pregnancy - Reviewed normal anemia panel with patient today. Also discussed /silent carrier alpha-thal, carrier hgb E, with the recommendation for partner testing. Patient declined at this time.  - GTT at next visit. Discussed fasting protocol and meal planning for the night before.   4. Headaches in pregnancy  - Patient works full time and "long hours"Headaches improve with rest. Discussed the importance of staying hydrated, and getting adequate rest.  - Recommended PO Tylenol for headaches that don't improve with rest. Also discussed headache that do not improve after Tylenol to report to [facility]U.    Preterm labor symptoms and general obstetric precautions including but not limited to vaginal bleeding, contractions, leaking of fluid and fetal movement were reviewed in detail with the patient. Please refer to After Visit Summary for other counseling recommendations.   Return in about 4 weeks (around 05/24/2022) for LOB w GTT.  Future Appointments  Date Time Provider Department Center  05/24/2022  9:30 AM [facility]  05/24/2022 11:15 AM Adam Phenix, MD [facility]   Edana Aguado Danella Deis) Suzie Portela, MSN, CNM  Center for [facility]  04/26/22 12:27 PM

## 2022-05-24 ENCOUNTER — Other Ambulatory Visit: Payer: Medicaid Other

## 2022-05-24 ENCOUNTER — Ambulatory Visit (INDEPENDENT_AMBULATORY_CARE_PROVIDER_SITE_OTHER): Payer: Medicaid Other | Admitting: Family Medicine

## 2022-05-24 ENCOUNTER — Other Ambulatory Visit: Payer: Self-pay

## 2022-05-24 ENCOUNTER — Encounter (HOSPITAL_COMMUNITY): Payer: Self-pay | Admitting: Obstetrics & Gynecology

## 2022-05-24 VITALS — BP 116/82 | HR 84 | Wt 159.0 lb

## 2022-05-24 DIAGNOSIS — Z148 Genetic carrier of other disease: Secondary | ICD-10-CM

## 2022-05-24 DIAGNOSIS — Z3492 Encounter for supervision of normal pregnancy, unspecified, second trimester: Secondary | ICD-10-CM

## 2022-05-24 DIAGNOSIS — Z23 Encounter for immunization: Secondary | ICD-10-CM | POA: Diagnosis not present

## 2022-05-24 DIAGNOSIS — Z789 Other specified health status: Secondary | ICD-10-CM

## 2022-05-24 DIAGNOSIS — Z98891 History of uterine scar from previous surgery: Secondary | ICD-10-CM

## 2022-05-24 DIAGNOSIS — O24419 Gestational diabetes mellitus in pregnancy, unspecified control: Secondary | ICD-10-CM

## 2022-05-24 NOTE — Progress Notes (Signed)
   PRENATAL VISIT NOTE  Subjective:  Kelli Christian is a 31 y.o. G3P3003 at [redacted]w[redacted]d being seen today for ongoing prenatal care.  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  Patient reports no complaints.  Contractions: Not present. Vag. Bleeding: None.  Movement: Present. Denies leaking of fluid.   The following portions of the patient's history were reviewed and updated as appropriate: allergies, current medications, past family history, past medical history, past social history, past surgical history and problem list.   Objective:   Vitals:   05/24/22 1109  BP: 116/82  Pulse: 84  Weight: 159 lb (72.1 kg)    Fetal Status: Fetal Heart Rate (bpm): 148 Fundal Height: 25 cm Movement: Present     General:  Alert, oriented and cooperative. Patient is in no acute distress.  Skin: Skin is warm and dry. No rash noted.   Cardiovascular: Normal heart rate noted  Respiratory: Normal respiratory effort, no problems with respiration noted  Abdomen: Soft, gravid, appropriate for gestational age.  Pain/Pressure: Absent     Pelvic: Cervical exam deferred        Extremities: Normal range of motion.  Edema: Trace  Mental Status: Normal mood and affect. Normal behavior. Normal judgment and thought content.   Assessment and Plan:  Pregnancy: G3P3003 at [redacted]w[redacted]d 1. Encounter for supervision of low-risk pregnancy in second trimester Continue routine prenatal care. - Tdap vaccine greater than or equal to 7yo IM - Flu Vaccine QUAD 76mo+IM (Fluarix, Fluzone & Alfiuria Quad PF)  2. History of C-section Would like TOLAC--to have a consent and discussion of R/B with in person interpreter next visit.  3. 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 Language barrier affecting health  care [place] interpreter: [name] on Video used  Preterm labor symptoms and general obstetric precautions including but not limited to vaginal bleeding, contractions, leaking of fluid and fetal movement were reviewed in detail with the patient. Please refer to After Visit Summary for other counseling recommendations.   Return in 3 weeks (on 06/14/2022).  Future Appointments  Date Time Provider Department Center  06/14/2022 10:35 AM Kathlene Cote [facility]    Reva Bores, MD

## 2022-05-25 LAB — CBC
Hematocrit: 31.8 % — ABNORMAL LOW (ref 34.0–46.6)
Hemoglobin: 10 g/dL — ABNORMAL LOW (ref 11.1–15.9)
MCH: 23.4 pg — ABNORMAL LOW (ref 26.6–33.0)
MCHC: 31.4 g/dL — ABNORMAL LOW (ref 31.5–35.7)
MCV: 74 fL — ABNORMAL LOW (ref 79–97)
Platelets: 286 10*3/uL (ref 150–450)
RBC: 4.28 x10E6/uL (ref 3.77–5.28)
RDW: 13.2 % (ref 11.7–15.4)
WBC: 8.7 10*3/uL (ref 3.4–10.8)

## 2022-05-25 LAB — GLUCOSE TOLERANCE, 2 HOURS W/ 1HR
Glucose, 1 hour: 130 mg/dL (ref 70–179)
Glucose, 2 hour: 133 mg/dL (ref 70–152)
Glucose, Fasting: 92 mg/dL — ABNORMAL HIGH (ref 70–91)

## 2022-05-25 LAB — HIV ANTIBODY (ROUTINE TESTING W REFLEX): HIV Screen 4th Generation wRfx: NONREACTIVE

## 2022-05-25 LAB — RPR: RPR Ser Ql: NONREACTIVE

## 2022-05-26 ENCOUNTER — Encounter: Payer: Self-pay | Admitting: Family Medicine

## 2022-05-26 DIAGNOSIS — Z8632 Personal history of gestational diabetes: Secondary | ICD-10-CM | POA: Insufficient documentation

## 2022-05-26 DIAGNOSIS — O24419 Gestational diabetes mellitus in pregnancy, unspecified control: Secondary | ICD-10-CM | POA: Insufficient documentation

## 2022-05-28 ENCOUNTER — Telehealth: Payer: Self-pay

## 2022-05-28 MED ORDER — ACCU-CHEK GUIDE W/DEVICE KIT: 1.0000 | PACK | Freq: Once | 0 refills | Status: AC

## 2022-05-28 MED ORDER — ACCU-CHEK SOFTCLIX LANCETS MISC: 12 refills | Status: DC

## 2022-05-28 NOTE — Telephone Encounter (Signed)
-----   Message from Donnamae Jude, MD sent at 05/26/2022  8:00 PM EDT ----- Has GDM--please refer for teaching and education and give supplies.

## 2022-05-28 NOTE — Telephone Encounter (Signed)
Called pt using [employer] # [phone]. RN called both phone numbers on file for pt. Voicemails were left with both numbers. Will attempt to contact at a later time.

## 2022-05-30 MED ORDER — ACCU-CHEK GUIDE W/DEVICE KIT: 1.0000 | PACK | Freq: Four times a day (QID) | 0 refills | Status: DC

## 2022-05-30 MED ORDER — GLUCOSE BLOOD VI STRP: ORAL_STRIP | 12 refills | Status: DC

## 2022-05-30 NOTE — Addendum Note (Signed)
Addended by: Georgia Lopes on: 05/30/2022 01:56 PM   Modules accepted: Orders

## 2022-05-30 NOTE — Telephone Encounter (Signed)
Call placed to pt with CAP interpreter Keo. Spoke with pt. Pt given results and recommendations per Dr Kennon Rounds. Pt verbalized understanding and agreeable to plan of care.  Diabetes Supplies sent to pharmacy on file. Diabetes ED scheduled for 10/3 at 1115. Pt agreeable to date and time of appt.  Colletta [place], RNC

## 2022-06-06 ENCOUNTER — Other Ambulatory Visit: Payer: Self-pay

## 2022-06-06 DIAGNOSIS — O24419 Gestational diabetes mellitus in pregnancy, unspecified control: Secondary | ICD-10-CM

## 2022-06-11 ENCOUNTER — Ambulatory Visit (INDEPENDENT_AMBULATORY_CARE_PROVIDER_SITE_OTHER): Payer: Medicaid Other | Admitting: Registered"

## 2022-06-11 ENCOUNTER — Encounter: Payer: Medicaid Other | Attending: Pediatrics | Admitting: Registered"

## 2022-06-11 ENCOUNTER — Other Ambulatory Visit: Payer: Self-pay

## 2022-06-11 DIAGNOSIS — Z713 Dietary counseling and surveillance: Secondary | ICD-10-CM | POA: Insufficient documentation

## 2022-06-11 DIAGNOSIS — O24419 Gestational diabetes mellitus in pregnancy, unspecified control: Secondary | ICD-10-CM

## 2022-06-11 NOTE — Progress Notes (Signed)
Patient was seen for Gestational Diabetes self-management on 06/11/22  Start time 1118 and End time 1215   Estimated due date: n/a ; [redacted]w[redacted]d  Clinical: Medications: reviewed Medical History: reviewed Labs: OGTT 92-130-133, A1c 4.6%  02/12/22  Dietary and Lifestyle History: Pt states she stopped drinking soda when received diagnosis. Pt states she is still experiencing some nausea but states she is eating enough.  Pt states she she doesn't get enough sleep due to her work schedule and her kids school schedule. Sleeps 12:30 am - 6:00 am. May take a nap around 10 am. Has to go into work at 2 pm. Pt states she works on [employer] and walks a lot during her shifts.  Physical Activity: ADL (active at work) Stress: not assessed Sleep: not assessed  24 hr Recall: not assessed First Meal:  Snack: Second meal: Snack: Third meal: Snack: Beverages:  NUTRITION INTERVENTION  Nutrition education (E-1) on the following topics:   Initial Follow-up  [x]  []  Definition of Gestational Diabetes [x]  []  Why dietary management is important in controlling blood glucose [x]  []  Effects each nutrient has on blood glucose levels []  []  Simple carbohydrates vs complex carbohydrates [x]  []  Fluid intake [x]  []  Creating a balanced meal plan []  []  Carbohydrate counting  [x]  []  When to check blood glucose levels [x]  []  Proper blood glucose monitoring techniques [x]  []  Effect of stress and stress reduction techniques  [x]  []  Exercise effect on blood glucose levels, appropriate exercise during pregnancy []  []  Importance of limiting caffeine and abstaining from alcohol and smoking [x]  []  Medications used for blood sugar control during pregnancy []  []  Hypoglycemia and rule of 15 []  []  Postpartum self care  Patient brought meter Rx to visit.  FBS: 120 (appt was 11 am, had not eaten or drank anything prior to appt)  Patient instructed to monitor glucose levels: FBS: 60 - ? 95 mg/dL 1 hour: ? 140 mg/dL 2  hour: ? 120 mg/dL  Patient received handouts: Nutrition Diabetes and Pregnancy Carbohydrate Counting List  Patient will be seen for follow-up as needed.

## 2022-06-14 ENCOUNTER — Encounter: Payer: Self-pay | Admitting: Medical

## 2022-06-14 ENCOUNTER — Other Ambulatory Visit: Payer: Self-pay

## 2022-06-14 ENCOUNTER — Ambulatory Visit (INDEPENDENT_AMBULATORY_CARE_PROVIDER_SITE_OTHER): Payer: Medicaid Other | Admitting: Medical

## 2022-06-14 VITALS — BP 122/83 | HR 83 | Wt 164.3 lb

## 2022-06-14 DIAGNOSIS — D563 Thalassemia minor: Secondary | ICD-10-CM

## 2022-06-14 DIAGNOSIS — Z148 Genetic carrier of other disease: Secondary | ICD-10-CM

## 2022-06-14 DIAGNOSIS — Z98891 History of uterine scar from previous surgery: Secondary | ICD-10-CM

## 2022-06-14 DIAGNOSIS — O24410 Gestational diabetes mellitus in pregnancy, diet controlled: Secondary | ICD-10-CM

## 2022-06-14 DIAGNOSIS — Z3493 Encounter for supervision of normal pregnancy, unspecified, third trimester: Secondary | ICD-10-CM

## 2022-06-14 DIAGNOSIS — Z3A28 28 weeks gestation of pregnancy: Secondary | ICD-10-CM

## 2022-06-14 DIAGNOSIS — Z789 Other specified health status: Secondary | ICD-10-CM

## 2022-06-14 NOTE — Progress Notes (Signed)
   PRENATAL VISIT NOTE  Subjective:  Kelli Christian is a 31 y.o. G3P3003 at [redacted]w[redacted]d being seen today for ongoing prenatal care.  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  Patient reports no complaints.  Contractions: Not present. Vag. Bleeding: None.  Movement: Present. Denies leaking of fluid.   The following portions of the patient's history were reviewed and updated as appropriate: allergies, current medications, past family history, past medical history, past social history, past surgical history and problem list.   Objective:   Vitals:   06/14/22 1056  BP: 122/83  Pulse: 83  Weight: 164 lb 4.8 oz (74.5 kg)    Fetal Status: Fetal Heart Rate (bpm): 155 Fundal Height: 26 cm Movement: Present     General:  Alert, oriented and cooperative. Patient is in no acute distress.  Skin: Skin is warm and dry. No rash noted.   Cardiovascular: Normal heart rate noted  Respiratory: Normal respiratory effort, no problems with respiration noted  Abdomen: Soft, gravid, appropriate for gestational age.  Pain/Pressure: Absent     Pelvic: Cervical exam deferred        Extremities: Normal range of motion.  Edema: None  Mental Status: Normal mood and affect. Normal behavior. Normal judgment and thought content.   Assessment and Plan:  Pregnancy: G3P3003 at [redacted]w[redacted]d 1. Encounter for supervision of low-risk pregnancy in third trimester  2. Diet controlled gestational diabetes mellitus (GDM) in third trimester - Diabetes education 10/3 - Reviewed CBGs, patient only started checking CBGs 10/4, fasting mildly elevated, but only have 3 days of values   3. 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 Alpha thalassemia trait  5. History of C-section - TOLAC  consent reviewed today and signed with patient   6. Language barrier affecting health care - Interpreter present   7. [redacted] weeks gestation of pregnancy  Preterm labor symptoms and general obstetric precautions including but not limited to vaginal bleeding, contractions, leaking of fluid and fetal movement were reviewed in detail with the patient. Please refer to After Visit Summary for other counseling recommendations.   Return in about 2 weeks (around 06/28/2022) for [facility] APP, In-Person.  Future Appointments  Date Time Provider [place]  06/25/2022 11:15 AM [facility]  07/03/2022  8:15 AM Griffin Basil, MD [facility]  07/12/2022  9:35 AM Aletha Halim, MD [facility]    Kerry Hough, PA-C

## 2022-06-25 ENCOUNTER — Other Ambulatory Visit: Payer: Self-pay

## 2022-06-25 ENCOUNTER — Encounter: Payer: Medicaid Other | Attending: Medical | Admitting: Registered"

## 2022-06-25 ENCOUNTER — Ambulatory Visit: Payer: Medicaid Other | Admitting: Registered"

## 2022-06-25 DIAGNOSIS — O24419 Gestational diabetes mellitus in pregnancy, unspecified control: Secondary | ICD-10-CM | POA: Insufficient documentation

## 2022-06-25 DIAGNOSIS — O24410 Gestational diabetes mellitus in pregnancy, diet controlled: Secondary | ICD-10-CM

## 2022-06-25 DIAGNOSIS — Z713 Dietary counseling and surveillance: Secondary | ICD-10-CM | POA: Diagnosis not present

## 2022-06-25 NOTE — Progress Notes (Signed)
Patient was seen for Gestational Diabetes self-management on 06/25/2022  Start time 1121 and End time 1200   Estimated due date: 09/04/22 ; [redacted]w[redacted]d  Clinical: Medications: reviewed Medical History: reviewed Labs: OGTT 92-130-133, A1c 4.6%  02/12/22  Dietary and Lifestyle History: Patient brought blood sugar log to visit. Patient reports checking blood sugar before she eats, but did not understand to check blood sugar 2 hours after eating. Pt was writing the times in each space with a reading but were all random numbers.  Pt states she will set her timer on phone for 2 hours when she eats and start checking blood sugar when alarm goes off.   Pt states she eats between 1-2 cups of rice with lunch and dinner. Pt states she only drinks water.  Physical Activity: ADL (active at work) Stress: not assessed Sleep: not assessed  24 hr Recall:  First Meal: apple  Snack: Second meal: (12:00) pumpkin, rice, pork, water Snack: (5:25) Third meal: (8:25 pm)   Snack: Beverages: water   NUTRITION INTERVENTION  Nutrition education (E-1) on the following topics:   Initial Follow-up  [x]  []  Definition of Gestational Diabetes [x]  []  Why dietary management is important in controlling blood glucose [x]  []  Effects each nutrient has on blood glucose levels []  []  Simple carbohydrates vs complex carbohydrates [x]  []  Fluid intake [x]  []  Creating a balanced meal plan []  [x]  Carbohydrate counting (1 cup of rice vs 2 cups of rice) [x]  [x]  When to check blood glucose levels [x]  []  Proper blood glucose monitoring techniques [x]  []  Effect of stress and stress reduction techniques  [x]  []  Exercise effect on blood glucose levels, appropriate exercise during pregnancy []  []  Importance of limiting caffeine and abstaining from alcohol and smoking [x]  [x]  Medications used for blood sugar control during pregnancy []  []  Hypoglycemia and rule of 15 []  []  Postpartum self care  Patient instructed to monitor  glucose levels: FBS: 60 - ? 95 mg/dL 1 hour: ? 140 mg/dL 2 hour: ? 120 mg/dL  Patient received handouts: New glucose log  Patient will be seen for follow-up as needed.

## 2022-07-01 ENCOUNTER — Encounter: Payer: Medicaid Other | Admitting: Obstetrics and Gynecology

## 2022-07-03 ENCOUNTER — Ambulatory Visit (INDEPENDENT_AMBULATORY_CARE_PROVIDER_SITE_OTHER): Payer: Medicaid Other | Admitting: Obstetrics and Gynecology

## 2022-07-03 ENCOUNTER — Other Ambulatory Visit: Payer: Self-pay

## 2022-07-03 VITALS — BP 111/79 | HR 80 | Wt 167.4 lb

## 2022-07-03 DIAGNOSIS — Z148 Genetic carrier of other disease: Secondary | ICD-10-CM

## 2022-07-03 DIAGNOSIS — O24419 Gestational diabetes mellitus in pregnancy, unspecified control: Secondary | ICD-10-CM

## 2022-07-03 DIAGNOSIS — O24410 Gestational diabetes mellitus in pregnancy, diet controlled: Secondary | ICD-10-CM

## 2022-07-03 DIAGNOSIS — Z3A31 31 weeks gestation of pregnancy: Secondary | ICD-10-CM

## 2022-07-03 DIAGNOSIS — D563 Thalassemia minor: Secondary | ICD-10-CM

## 2022-07-03 DIAGNOSIS — Z98891 History of uterine scar from previous surgery: Secondary | ICD-10-CM

## 2022-07-03 DIAGNOSIS — Z789 Other specified health status: Secondary | ICD-10-CM

## 2022-07-03 DIAGNOSIS — Z3493 Encounter for supervision of normal pregnancy, unspecified, third trimester: Secondary | ICD-10-CM

## 2022-07-03 MED ORDER — ACCU-CHEK SOFTCLIX LANCETS MISC: 12 refills | Status: DC

## 2022-07-03 NOTE — Progress Notes (Signed)
   PRENATAL VISIT NOTE  Subjective:  Kelli Christian is a 31 y.o. G4P3003 at [redacted]w[redacted]d being seen today for ongoing prenatal care.  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  Patient doing well with no acute concerns today. She reports no complaints.  Contractions: Not present. Vag. Bleeding: None.  Movement: Present. Denies leaking of fluid.   The following portions of the patient's history were reviewed and updated as appropriate: allergies, current medications, past family history, past medical history, past social history, past surgical history and problem list. Problem list updated.  Objective:   Vitals:   07/03/22 0853  BP: 111/79  Pulse: 80  Weight: 167 lb 6.4 oz (75.9 kg)    Fetal Status: Fetal Heart Rate (bpm): 125 Fundal Height: 31 cm Movement: Present     General:  Alert, oriented and cooperative. Patient is in no acute distress.  Skin: Skin is warm and dry. No rash noted.   Cardiovascular: Normal heart rate noted  Respiratory: Normal respiratory effort, no problems with respiration noted  Abdomen: Soft, gravid, appropriate for gestational age.  Pain/Pressure: Absent     Pelvic: Cervical exam deferred        Extremities: Normal range of motion.  Edema: None  Mental Status:  Normal mood and affect. Normal behavior. Normal judgment and thought content.   Assessment and Plan:  Pregnancy: G4P3003 at [redacted]w[redacted]d  1. [redacted] weeks gestation of pregnancy   2. Diet controlled gestational diabetes mellitus (GDM) in third trimester Pt has started diet, FBS all elevated, most postprandials in range Pt will follow up in 1 week to re-eval fastings with diet, pt may need evening metformin Growth scan ordered - US MFM OB FOLLOW UP; Future  3.  Language barrier affecting health care Live interpreter present  4. History of C-section Pt desires TOLAC, consent signed  5. Encounter for supervision of low-risk pregnancy in third trimester Continue routine prenatal care  6. 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 Alpha thalassemia trait   8. Gestational diabetes mellitus (GDM), antepartum, gestational diabetes method of control unspecified  - Accu-Chek Softclix Lancets lancets; Use as instructed QID  Dispense: 100 each; Refill: 12  Preterm labor symptoms and general obstetric precautions including but not limited to vaginal bleeding, contractions, leaking of fluid and fetal movement were reviewed in detail with the patient.  Please refer to After Visit Summary for other counseling recommendations.   Return in about 1 week (around 07/10/2022) for [facility], in person.   Lynnda Shields, MD Faculty Attending Center for [facility]

## 2022-07-03 NOTE — Progress Notes (Signed)
Called pt to inform of [place] scheduled for 11/8 at 10:30am. Pt verbalized understanding.

## 2022-07-09 ENCOUNTER — Other Ambulatory Visit: Payer: Self-pay

## 2022-07-09 ENCOUNTER — Encounter: Payer: Medicaid Other | Attending: Pediatrics | Admitting: Registered"

## 2022-07-09 ENCOUNTER — Ambulatory Visit (INDEPENDENT_AMBULATORY_CARE_PROVIDER_SITE_OTHER): Payer: Medicaid Other | Admitting: Registered"

## 2022-07-09 ENCOUNTER — Other Ambulatory Visit: Payer: Self-pay | Admitting: Obstetrics and Gynecology

## 2022-07-09 DIAGNOSIS — Z3A32 32 weeks gestation of pregnancy: Secondary | ICD-10-CM | POA: Insufficient documentation

## 2022-07-09 DIAGNOSIS — Z713 Dietary counseling and surveillance: Secondary | ICD-10-CM | POA: Insufficient documentation

## 2022-07-09 DIAGNOSIS — O24410 Gestational diabetes mellitus in pregnancy, diet controlled: Secondary | ICD-10-CM

## 2022-07-09 DIAGNOSIS — O24419 Gestational diabetes mellitus in pregnancy, unspecified control: Secondary | ICD-10-CM | POA: Insufficient documentation

## 2022-07-09 MED ORDER — METFORMIN HCL 500 MG PO TABS: ORAL_TABLET | ORAL | 1 refills | Status: DC

## 2022-07-09 NOTE — Progress Notes (Signed)
Sounds good. I sent in metformin 500 with breakfast and 500 with dinner

## 2022-07-09 NOTE — Progress Notes (Signed)
Patient was seen for Gestational Diabetes self-management on 07/09/22  Start time 1040 and End time 1135   Estimated due date: n/a ; [redacted]w[redacted]d  Clinical: Medications: reviewed Medical History: reviewed Labs: OGTT 92-130-133, A1c 4.6%  02/12/22  Pt states she sets phone alarm to check blood sugar 2 hours after meals, But also said that she eats dinner at 5 pm and checks at 8:30 pm. Language barrier makes assessment time consuming and not sure it is accurate.    Dietary and Lifestyle History: Pt next Ob visit is 11/3 with Dr Ilda Basset. I told patient that Dr. Ilda Basset may want her to start Metformin before her next visit. When she picks up her strips refill she was advised to ask if there is another Rx available for her. Discussed taking medication with dinner if the medication is prescribed.   Pt states she is not eating during the night. Pt reports she has reduced the amount of rice to only 1/2 c. Pt states sometimes only eats vegetables for breakfast. Pt reports she is hungry in between meals, but doesn't eat due to blood sugar testing requirements.  Spent 30+ min trying to explain appropriate amount of carbs. Pt has a hard time grasping the idea of portion size and balance. Used food models and pictures.  Physical Activity: ADL (active at work) Stress: not assessed Sleep: not assessed  24 hr Recall:  First Meal: 2 slices of WIC wheat bread with butter, water Snack: none  Second meal: 1/2 c rice, ~3 oz pork, broccoli, asparagus Snack: Third meal: similar meal as lunch Snack: Beverages: water  NUTRITION INTERVENTION  Nutrition education (E-1) on the following topics:   Initial Follow-up  [x]  []  Definition of Gestational Diabetes [x]  []  Why dietary management is important in controlling blood glucose [x]  [x]  Effects each nutrient has on blood glucose levels []  []  Simple carbohydrates vs complex carbohydrates [x]  []  Fluid intake [x]  [x]  Creating a balanced meal  plan []  []  Carbohydrate counting  [x]  [x]  When to check blood glucose levels [x]  []  Proper blood glucose monitoring techniques [x]  []  Effect of stress and stress reduction techniques  [x]  []  Exercise effect on blood glucose levels, appropriate exercise during pregnancy []  []  Importance of limiting caffeine and abstaining from alcohol and smoking [x]  []  Medications used for blood sugar control during pregnancy []  []  Hypoglycemia and rule of 15 []  []  Postpartum self care  Patient instructed to monitor glucose levels: FBS: 60 - ? 95 mg/dL 1 hour: ? 140 mg/dL 2 hour: ? 120 mg/dL  Patient received handouts: Nutrition Diabetes and Pregnancy Carbohydrate Counting List  Patient will be seen for follow-up as needed.

## 2022-07-12 ENCOUNTER — Ambulatory Visit (INDEPENDENT_AMBULATORY_CARE_PROVIDER_SITE_OTHER): Payer: Medicaid Other | Admitting: Obstetrics and Gynecology

## 2022-07-12 ENCOUNTER — Other Ambulatory Visit: Payer: Self-pay

## 2022-07-12 VITALS — BP 109/72 | HR 84 | Wt 167.0 lb

## 2022-07-12 DIAGNOSIS — Z148 Genetic carrier of other disease: Secondary | ICD-10-CM

## 2022-07-12 DIAGNOSIS — D563 Thalassemia minor: Secondary | ICD-10-CM

## 2022-07-12 DIAGNOSIS — O24415 Gestational diabetes mellitus in pregnancy, controlled by oral hypoglycemic drugs: Secondary | ICD-10-CM | POA: Diagnosis not present

## 2022-07-12 DIAGNOSIS — Z3492 Encounter for supervision of normal pregnancy, unspecified, second trimester: Secondary | ICD-10-CM

## 2022-07-12 DIAGNOSIS — Z789 Other specified health status: Secondary | ICD-10-CM

## 2022-07-12 DIAGNOSIS — Z98891 History of uterine scar from previous surgery: Secondary | ICD-10-CM

## 2022-07-12 MED ORDER — PRENATAL VITAMIN 27-0.8 MG PO TABS: 1.0000 | ORAL_TABLET | Freq: Every day | ORAL | 1 refills | Status: DC

## 2022-07-13 LAB — BASIC METABOLIC PANEL
BUN/Creatinine Ratio: 17 (ref 9–23)
BUN: 6 mg/dL (ref 6–20)
CO2: 21 mmol/L (ref 20–29)
Calcium: 8.7 mg/dL (ref 8.7–10.2)
Chloride: 103 mmol/L (ref 96–106)
Creatinine, Ser: 0.36 mg/dL — ABNORMAL LOW (ref 0.57–1.00)
Glucose: 82 mg/dL (ref 70–99)
Potassium: 3.4 mmol/L — ABNORMAL LOW (ref 3.5–5.2)
Sodium: 136 mmol/L (ref 134–144)
eGFR: 139 mL/min/{1.73_m2} (ref >59)

## 2022-07-13 NOTE — Progress Notes (Unsigned)
   PRENATAL VISIT NOTE  Subjective:  Kelli Christian is a 31 y.o. G4P3003 at [redacted]w[redacted]d being seen today for ongoing prenatal care.  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  Patient reports no complaints.  Contractions: Irritability. Vag. Bleeding: None.  Movement: Present. Denies leaking of fluid.   The following portions of the patient's history were reviewed and updated as appropriate: allergies, current medications, past family history, past medical history, past social history, past surgical history and problem list.   Objective:   Vitals:   07/12/22 0950  BP: 109/72  Pulse: 84  Weight: 167 lb (75.8 kg)    Fetal Status: Fetal Heart Rate (bpm): 155   Movement: Present     General:  Alert, oriented and cooperative. Patient is in no acute distress.  Skin: Skin is warm and dry. No rash noted.   Cardiovascular: Normal heart rate noted  Respiratory: Normal respiratory effort, no problems with respiration noted  Abdomen: Soft, gravid, appropriate for gestational age.  Pain/Pressure: Present     Pelvic: Cervical exam deferred        Extremities: Normal range of motion.     Mental Status: Normal mood and affect. Normal behavior. Normal judgment and thought content.   Assessment and Plan:  Pregnancy: G4P3003 at [redacted]w[redacted]d 1. 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 Alpha thalassemia trait - Prenatal Vit-Fe Fumarate-FA (PRENATAL VITAMIN) 27-0.8 MG TABS; Take 1 tablet by mouth daily.  Dispense: 90 tablet; Refill: 1  3. Oral hypoglycemic controlled White classification A2 gestational diabetes mellitus (GDM) Patient seen on 10/31 by DM education and had some elevated CBGs so metformin 500 bid start. Pt brought bottle and it says bid but she is  only taking it at night after she finishes dinner; she started it on 10/31. CBGs look improved but still slightly elevated with AM fastings in the 90s-100s and a few 2 hour post prandials 120s-130s. I told her that I recommend to do metformin 500 with breakfast (take with first bite) and same with dinner Will start qwk testing when she has repeat growth u/s next week - [place] MFM FETAL BPP WO NON STRESS; Future - Basic metabolic panel  4. Encounter for supervision of low-risk pregnancy in second trimester - Prenatal Vit-Fe Fumarate-FA (PRENATAL VITAMIN) 27-0.8 MG TABS; Take 1 tablet by mouth daily.  Dispense: 90 tablet; Refill: 1  5. Language barrier affecting health care [place] interpreter  6. History of C-section VDx2, PLTCS for breech. VBAC consent signed on 10/6  Preterm labor symptoms and general obstetric precautions including but not limited to vaginal bleeding, contractions, leaking of fluid and fetal movement were reviewed in detail with the patient. Please refer to After Visit Summary for other counseling recommendations.   Return in about 5 days (around 07/17/2022) for in person, md visit, high risk ob.  Future Appointments  Date Time Provider [place]  07/17/2022  8:55 AM Clarnce Flock, MD [facility]  07/17/2022 10:30 AM [facility] NURSE [facility]  07/17/2022 10:45 AM [facility] US6 [facility]  07/24/2022  8:15 AM [facility] NST [facility]  07/30/2022  9:35 AM Clarnce Flock, MD [facility]  07/31/2022  8:15 AM [facility] NST [facility]    Aletha Halim, MD

## 2022-07-14 ENCOUNTER — Emergency Department (HOSPITAL_COMMUNITY)
Admission: EM | Admit: 2022-07-14 | Discharge: 2022-07-14 | Discharge status: Against Medical Advice | Payer: Medicaid Other | Attending: Emergency Medicine | Admitting: Emergency Medicine

## 2022-07-14 ENCOUNTER — Encounter (HOSPITAL_COMMUNITY): Payer: Self-pay

## 2022-07-14 ENCOUNTER — Other Ambulatory Visit: Payer: Self-pay

## 2022-07-14 DIAGNOSIS — O99513 Diseases of the respiratory system complicating pregnancy, third trimester: Secondary | ICD-10-CM | POA: Insufficient documentation

## 2022-07-14 DIAGNOSIS — Z20822 Contact with and (suspected) exposure to covid-19: Secondary | ICD-10-CM | POA: Insufficient documentation

## 2022-07-14 DIAGNOSIS — Z5321 Procedure and treatment not carried out due to patient leaving prior to being seen by health care provider: Secondary | ICD-10-CM | POA: Diagnosis not present

## 2022-07-14 DIAGNOSIS — R059 Cough, unspecified: Secondary | ICD-10-CM | POA: Insufficient documentation

## 2022-07-14 DIAGNOSIS — J029 Acute pharyngitis, unspecified: Secondary | ICD-10-CM | POA: Diagnosis not present

## 2022-07-14 DIAGNOSIS — Z3A32 32 weeks gestation of pregnancy: Secondary | ICD-10-CM | POA: Diagnosis not present

## 2022-07-14 LAB — SARS CORONAVIRUS 2 BY RT PCR: SARS Coronavirus 2 by RT PCR: NEGATIVE

## 2022-07-14 LAB — GROUP A STREP BY PCR: Group A Strep by PCR: DETECTED — AB

## 2022-07-14 NOTE — ED Notes (Signed)
Called pt for vital recheck x3 with no response. Pt removed from floor.

## 2022-07-14 NOTE — ED Provider Triage Note (Signed)
Emergency Medicine Provider Triage Evaluation Note  Garry Ortner , a 31 y.o. female  was evaluated in triage.  Pt complains of cough and sore throat.  Patient is [redacted] weeks pregnant.  She presented to the ER today for evaluation of cough and sore throat onset last night.  She reports a nonproductive cough which has been persistent, no aggravating or alleviating factors she reports her cough has caused her to have a sore throat and a mild scratchy sensation which is constant.  She denies any additional concerns.  Specifically patient denies any abdominal pain, vaginal bleeding, vomiting or concerns for her pregnancy today.  [place] Marketing executive used during this visit.  Patient's husband at bedside provides supplemental history.  Review of Systems  Positive: Nonproductive cough, sore throat Negative: Fever, abdominal pain, vaginal bleeding, vomiting or any additional concerns  Physical Exam  BP 125/83 (BP Location: Right Arm)   Pulse 89   Temp 98.4 F (36.9 C) (Oral)   Resp 18   LMP 11/13/2021   SpO2 100%  Gen:   Awake, no distress   Resp:  Normal effort, lungs clear to auscultation bilaterally MSK:   Moves extremities without difficulty  Other:  Heart regular rate and rhythm.  Oropharynx unremarkable, no erythema or swelling.  Medical Decision Making  Medically screening exam initiated at 8:27 AM.  Appropriate orders placed.  Robertha Grantz was informed that the remainder of the evaluation will be completed by another provider, this initial triage assessment does not replace that evaluation, and the importance of remaining in the ED until their evaluation is complete.   Note: Portions of this report may have been transcribed using voice recognition software. Every effort was made to ensure accuracy; however, inadvertent computerized transcription errors may still be present.    Deliah Boston, PA-C 07/14/22 0830

## 2022-07-14 NOTE — ED Triage Notes (Signed)
Pt arrived POV from home c/o a sore throat and coughing too much that started last night.

## 2022-07-17 ENCOUNTER — Other Ambulatory Visit: Payer: Self-pay | Admitting: *Deleted

## 2022-07-17 ENCOUNTER — Ambulatory Visit (INDEPENDENT_AMBULATORY_CARE_PROVIDER_SITE_OTHER): Payer: Medicaid Other | Admitting: Family Medicine

## 2022-07-17 ENCOUNTER — Ambulatory Visit: Payer: Medicaid Other | Admitting: *Deleted

## 2022-07-17 ENCOUNTER — Encounter: Payer: Self-pay | Admitting: Family Medicine

## 2022-07-17 ENCOUNTER — Ambulatory Visit: Payer: Medicaid Other | Attending: Obstetrics and Gynecology

## 2022-07-17 ENCOUNTER — Other Ambulatory Visit: Payer: Self-pay

## 2022-07-17 VITALS — BP 119/76 | HR 88 | Wt 172.2 lb

## 2022-07-17 VITALS — BP 114/71 | HR 92

## 2022-07-17 DIAGNOSIS — O24415 Gestational diabetes mellitus in pregnancy, controlled by oral hypoglycemic drugs: Secondary | ICD-10-CM

## 2022-07-17 DIAGNOSIS — Z3A33 33 weeks gestation of pregnancy: Secondary | ICD-10-CM | POA: Diagnosis not present

## 2022-07-17 DIAGNOSIS — Z3493 Encounter for supervision of normal pregnancy, unspecified, third trimester: Secondary | ICD-10-CM

## 2022-07-17 DIAGNOSIS — O358XX0 Maternal care for other (suspected) fetal abnormality and damage, not applicable or unspecified: Secondary | ICD-10-CM

## 2022-07-17 DIAGNOSIS — O24410 Gestational diabetes mellitus in pregnancy, diet controlled: Secondary | ICD-10-CM

## 2022-07-17 DIAGNOSIS — O24419 Gestational diabetes mellitus in pregnancy, unspecified control: Secondary | ICD-10-CM

## 2022-07-17 DIAGNOSIS — Z98891 History of uterine scar from previous surgery: Secondary | ICD-10-CM

## 2022-07-17 DIAGNOSIS — Z789 Other specified health status: Secondary | ICD-10-CM

## 2022-07-17 MED ORDER — METFORMIN HCL 500 MG PO TABS: 1000.0000 mg | ORAL_TABLET | Freq: Two times a day (BID) | ORAL | 5 refills | Status: DC

## 2022-07-17 NOTE — Progress Notes (Signed)
   Subjective:  Kelli Christian is a 31 y.o. G4P3003 at [redacted]w[redacted]d being seen today for ongoing prenatal care.  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  Patient reports no complaints.  Contractions: Not present. Vag. Bleeding: None.  Movement: Present. Denies leaking of fluid.   The following portions of the patient's history were reviewed and updated as appropriate: allergies, current medications, past family history, past medical history, past social history, past surgical history and problem list. Problem list updated.  Objective:   Vitals:   07/17/22 0902  BP: 119/76  Pulse: 88  Weight: 172 lb 3.2 oz (78.1 kg)    Fetal Status: Fetal Heart Rate (bpm): 140   Movement: Present     General:  Alert, oriented and cooperative. Patient is in no acute distress.  Skin: Skin is warm and dry. No rash noted.   Cardiovascular: Normal heart rate noted  Respiratory: Normal respiratory effort, no problems with respiration noted  Abdomen: Soft, gravid, appropriate for gestational age. Pain/Pressure: Absent     Pelvic: Vag. Bleeding: None     Cervical exam deferred        Extremities: Normal range of motion.     Mental Status: Normal mood and affect. Normal behavior. Normal judgment and thought content.   Urinalysis:      Assessment and Plan:  Pregnancy: G4P3003 at [redacted]w[redacted]d  1. Encounter for supervision of low-risk pregnancy in third trimester BP and FHR normal   2. Gestational diabetes mellitus (GDM) in third trimester controlled on oral hypoglycemic drug Log reviewed, 100% not at goal over past five days Increase metformin to 1g BID Has growth [place] later this morning, needs weekly BPP   3. History of C-section Breech presentation TOLAC consent  signed 06/14/2022  4. Language barrier affecting health care Estanislado Spire in person interpreter present throughout visit  Preterm labor symptoms and general obstetric precautions including but not limited to vaginal bleeding, contractions, leaking of fluid and fetal movement were reviewed in detail with the patient. Please refer to After Visit Summary for other counseling recommendations.  Return in 2 weeks (on 07/31/2022) for [facility], ob visit.   Venora Maples, MD

## 2022-07-17 NOTE — Patient Instructions (Signed)

## 2022-07-24 ENCOUNTER — Other Ambulatory Visit: Payer: Self-pay

## 2022-07-24 ENCOUNTER — Ambulatory Visit: Payer: Medicaid Other | Admitting: *Deleted

## 2022-07-24 ENCOUNTER — Ambulatory Visit (INDEPENDENT_AMBULATORY_CARE_PROVIDER_SITE_OTHER): Payer: Medicaid Other

## 2022-07-24 VITALS — BP 112/68 | HR 79

## 2022-07-24 DIAGNOSIS — O24415 Gestational diabetes mellitus in pregnancy, controlled by oral hypoglycemic drugs: Secondary | ICD-10-CM | POA: Diagnosis not present

## 2022-07-24 NOTE — Progress Notes (Signed)
Pt informed that the ultrasound is considered a limited OB ultrasound and is not intended to be a complete ultrasound exam.  Patient also informed that the ultrasound is not being completed with the intent of assessing for fetal or placental anomalies or any pelvic abnormalities.  Explained that the purpose of today's ultrasound is to assess for presentation, BPP and amniotic fluid volume.  Patient acknowledges the purpose of the exam and the limitations of the study.     Pt advised to go to MAU or evaluation if she has fluid leaking from vagina or decreased fetal movement

## 2022-07-30 ENCOUNTER — Ambulatory Visit (INDEPENDENT_AMBULATORY_CARE_PROVIDER_SITE_OTHER): Payer: Medicaid Other | Admitting: Registered"

## 2022-07-30 ENCOUNTER — Encounter: Payer: Self-pay | Admitting: Family Medicine

## 2022-07-30 ENCOUNTER — Encounter: Payer: Medicaid Other | Attending: Pediatrics | Admitting: Registered"

## 2022-07-30 ENCOUNTER — Ambulatory Visit (INDEPENDENT_AMBULATORY_CARE_PROVIDER_SITE_OTHER): Payer: Medicaid Other | Admitting: Family Medicine

## 2022-07-30 VITALS — BP 111/75 | HR 83 | Wt 168.4 lb

## 2022-07-30 DIAGNOSIS — Z3A34 34 weeks gestation of pregnancy: Secondary | ICD-10-CM

## 2022-07-30 DIAGNOSIS — Z789 Other specified health status: Secondary | ICD-10-CM

## 2022-07-30 DIAGNOSIS — Z98891 History of uterine scar from previous surgery: Secondary | ICD-10-CM

## 2022-07-30 DIAGNOSIS — O24410 Gestational diabetes mellitus in pregnancy, diet controlled: Secondary | ICD-10-CM | POA: Insufficient documentation

## 2022-07-30 DIAGNOSIS — Z3A31 31 weeks gestation of pregnancy: Secondary | ICD-10-CM | POA: Diagnosis not present

## 2022-07-30 DIAGNOSIS — O24414 Gestational diabetes mellitus in pregnancy, insulin controlled: Secondary | ICD-10-CM

## 2022-07-30 DIAGNOSIS — Z713 Dietary counseling and surveillance: Secondary | ICD-10-CM | POA: Insufficient documentation

## 2022-07-30 DIAGNOSIS — O24415 Gestational diabetes mellitus in pregnancy, controlled by oral hypoglycemic drugs: Secondary | ICD-10-CM

## 2022-07-30 DIAGNOSIS — Z3493 Encounter for supervision of normal pregnancy, unspecified, third trimester: Secondary | ICD-10-CM

## 2022-07-30 MED ORDER — "INSULIN SYRINGE 30G X 5/16"" 0.5 ML MISC": 1.0000 | Freq: Two times a day (BID) | 11 refills | Status: DC

## 2022-07-30 MED ORDER — INSULIN NPH (HUMAN) (ISOPHANE) 100 UNIT/ML ~~LOC~~ SUSP: 10.0000 [IU] | Freq: Two times a day (BID) | SUBCUTANEOUS | 3 refills | Status: DC

## 2022-07-30 NOTE — Patient Instructions (Signed)

## 2022-07-30 NOTE — Progress Notes (Signed)
   Subjective:  Kelli Christian is a 31 y.o. G4P3003 at [redacted]w[redacted]d being seen today for ongoing prenatal care.  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  Patient reports no complaints.  Contractions: Not present. Vag. Bleeding: None.  Movement: Present. Denies leaking of fluid.   The following portions of the patient's history were reviewed and updated as appropriate: allergies, current medications, past family history, past medical history, past social history, past surgical history and problem list. Problem list updated.  Objective:   Vitals:   07/30/22 0951  BP: 111/75  Pulse: 83  Weight: 168 lb 6.4 oz (76.4 kg)    Fetal Status: Fetal Heart Rate (bpm): 147   Movement: Present     General:  Alert, oriented and cooperative. Patient is in no acute distress.  Skin: Skin is warm and dry. No rash noted.   Cardiovascular: Normal heart rate noted  Respiratory: Normal respiratory effort, no problems with respiration noted  Abdomen: Soft, gravid, appropriate for gestational age. Pain/Pressure: Present     Pelvic: Vag. Bleeding: None     Cervical exam deferred        Extremities: Normal range of motion.     Mental Status: Normal mood and affect. Normal behavior. Normal judgment and thought content.   Urinalysis:        Assessment and Plan:  Pregnancy: G4P3003 at [redacted]w[redacted]d  1. Encounter for supervision of low-risk pregnancy in third trimester BP and FHR normal  2. Gestational diabetes mellitus (GDM) in third trimester controlled on oral hypoglycemic drug Increased to metformin 1g BID last visit Log reviewed, sugars improved but still largely uncontrolled, see above Discussed initiating insulin, NPH 10u BID, she understands need Insulin  teaching performed prior to her leaving the clinic today with Marylene Land  Works second shift, we spent a long time discussing timing of insulin, wakes up and has breakfast between 6-7 AM so no issue there, discussed pre-filling a syringe and taking it along with her dinner that she packs in some ice to keep it cold to be given around 6-7 PM Last growth [place] 07/17/2022, EFW 22%, AFI 11, has follow up scheduled for 08/16/2022 Determine delivery timing at next visit pending glucose control  3. Language barrier affecting health care Estanislado Spire in person interpreter [place] present throughout visit  4. History of C-section TOLAC consent signed 06/14/2022  Preterm labor symptoms and general obstetric precautions including but not limited to vaginal bleeding, contractions, leaking of fluid and fetal movement were reviewed in detail with the patient. Please refer to After Visit Summary for other counseling recommendations.  Return in 2 weeks (on 08/13/2022) for ob visit, [facility].   Venora Maples, MD

## 2022-07-30 NOTE — Progress Notes (Signed)
Insulin Instruction  Patient was seen on 07/30/22 for insulin instruction.   Start: 1020     End: 54   MD orders are:   NPH 10 units bid with meals  The following learning objectives were met by the patient during this visit:   Insulin Action of NPH   Reviewed syringe & vial including # units per syringe and vial  Hygiene and storage  Drawing up single dose using vials  Rotation of Sites  Hypoglycemia- symptoms, causes, treatment choices  Record keeping and MD follow up  Patient demonstrated understanding of insulin administration by return demonstration.  Patient received the following handouts: Insulin Instruction Handout                                        Patient to start on insulin as Rx'd by MD

## 2022-07-31 ENCOUNTER — Ambulatory Visit: Payer: Medicaid Other | Admitting: *Deleted

## 2022-07-31 ENCOUNTER — Ambulatory Visit (INDEPENDENT_AMBULATORY_CARE_PROVIDER_SITE_OTHER): Payer: Medicaid Other

## 2022-07-31 VITALS — BP 108/74 | HR 94 | Wt 168.3 lb

## 2022-07-31 DIAGNOSIS — O24414 Gestational diabetes mellitus in pregnancy, insulin controlled: Secondary | ICD-10-CM

## 2022-07-31 NOTE — Progress Notes (Signed)

## 2022-08-08 ENCOUNTER — Other Ambulatory Visit: Payer: Self-pay

## 2022-08-08 ENCOUNTER — Ambulatory Visit: Payer: Medicaid Other | Admitting: *Deleted

## 2022-08-08 ENCOUNTER — Ambulatory Visit (INDEPENDENT_AMBULATORY_CARE_PROVIDER_SITE_OTHER): Payer: Medicaid Other

## 2022-08-08 VITALS — BP 113/76 | HR 81 | Wt 170.3 lb

## 2022-08-08 DIAGNOSIS — Z3A36 36 weeks gestation of pregnancy: Secondary | ICD-10-CM

## 2022-08-08 DIAGNOSIS — O24414 Gestational diabetes mellitus in pregnancy, insulin controlled: Secondary | ICD-10-CM | POA: Diagnosis not present

## 2022-08-08 NOTE — Progress Notes (Signed)

## 2022-08-12 NOTE — Progress Notes (Signed)
Patient seen and assessed by nursing staff.  Agree with documentation and plan.  NST:  Baseline: 135 bpm, Variability: Good {> 6 bpm), Accelerations: Reactive, and Decelerations: Absent   

## 2022-08-15 ENCOUNTER — Encounter: Payer: Medicaid Other | Admitting: Family Medicine

## 2022-08-15 NOTE — Progress Notes (Addendum)
   PRENATAL VISIT NOTE  Subjective:  Kelli Christian is a 31 y.o. G4P3003 at [redacted]w[redacted]d being seen today for ongoing prenatal care.  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  Patient reports no complaints.  Contractions: Irritability. Vag. Bleeding: None.  Movement: Present. Denies leaking of fluid.   The following portions of the patient's history were reviewed and updated as appropriate: allergies, current medications, past family history, past medical history, past social history, past surgical history and problem list.   Objective:   Vitals:   08/16/22 0947  BP: 129/89  Pulse: 75  Weight: 171 lb (77.6 kg)    Fetal Status: Fetal Heart Rate (bpm): 135   Movement: Present     General:  Alert, oriented and cooperative. Patient is in no acute distress.  Skin: Skin is warm and dry. No rash noted.   Cardiovascular: Normal heart rate noted  Respiratory: Normal respiratory effort, no problems with respiration noted  Abdomen: Soft, gravid, appropriate for gestational age.  Pain/Pressure: Present     Pelvic: Cervical exam deferred Dilation: 1 Effacement (%): 50 Station: -2  Extremities: Normal range of motion.  Edema: Trace  Mental Status: Normal mood and affect. Normal behavior. Normal judgment and thought content.   Assessment and Plan:  Pregnancy: G4P3003 at [redacted]w[redacted]d 1. Insulin controlled gestational diabetes mellitus (GDM) in third trimester CBGs reviewed: Hasn't been checking recently because ran out of testing supplies despite refills at pharmacy. We will call and clarify and get her more testing supplies.  Current regimen: MTF, NPH 10uBID Growth today - 38%ile, AFI 8.8.  Delivery recommended at 39 weeks. Scheduled for 12/21. Reviewed with  pt and she has no issues with this.   2. Language barrier affecting health care Interpreter used throughout - Maureen Ralphs with video program  3. History of C-section Desires TOLAC, signed consent 10/6. 2 prior successful SVDs and prior CD due to breech.   4. Encounter for supervision of low-risk pregnancy in third trimester Cx done today.   Term labor symptoms and general obstetric precautions including but not limited to vaginal bleeding, contractions, leaking of fluid and fetal movement were reviewed in detail with the patient. Please refer to After Visit Summary for other counseling recommendations.   Return in about 1 week (around 08/23/2022) for OB VISIT, MD only.  Future Appointments  Date Time Provider Department Center  08/16/2022 10:15 AM Milas Hock, MD [facility]  08/21/2022 10:15 AM Venora Maples, MD [facility]  08/21/2022 11:15 AM [facility] NST [facility]    Milas Hock, MD

## 2022-08-16 ENCOUNTER — Other Ambulatory Visit (HOSPITAL_COMMUNITY)
Admission: RE | Admit: 2022-08-16 | Discharge: 2022-08-16 | Disposition: A | Discharge status: Home / Self Care | Payer: Medicaid Other | Source: Ambulatory Visit | Attending: Family Medicine | Admitting: Family Medicine

## 2022-08-16 ENCOUNTER — Ambulatory Visit: Payer: Medicaid Other | Admitting: *Deleted

## 2022-08-16 ENCOUNTER — Ambulatory Visit: Payer: Medicaid Other | Attending: Maternal & Fetal Medicine

## 2022-08-16 ENCOUNTER — Encounter: Payer: Self-pay | Admitting: Obstetrics and Gynecology

## 2022-08-16 ENCOUNTER — Ambulatory Visit (INDEPENDENT_AMBULATORY_CARE_PROVIDER_SITE_OTHER): Payer: Medicaid Other | Admitting: Obstetrics and Gynecology

## 2022-08-16 VITALS — BP 129/89 | HR 75 | Wt 171.0 lb

## 2022-08-16 VITALS — BP 120/70 | HR 73

## 2022-08-16 DIAGNOSIS — O24414 Gestational diabetes mellitus in pregnancy, insulin controlled: Secondary | ICD-10-CM

## 2022-08-16 DIAGNOSIS — Z3493 Encounter for supervision of normal pregnancy, unspecified, third trimester: Secondary | ICD-10-CM | POA: Insufficient documentation

## 2022-08-16 DIAGNOSIS — Z98891 History of uterine scar from previous surgery: Secondary | ICD-10-CM

## 2022-08-16 DIAGNOSIS — O24419 Gestational diabetes mellitus in pregnancy, unspecified control: Secondary | ICD-10-CM | POA: Diagnosis not present

## 2022-08-16 DIAGNOSIS — Z3A37 37 weeks gestation of pregnancy: Secondary | ICD-10-CM

## 2022-08-16 DIAGNOSIS — Z789 Other specified health status: Secondary | ICD-10-CM

## 2022-08-19 ENCOUNTER — Encounter: Payer: Self-pay | Admitting: *Deleted

## 2022-08-19 LAB — GC/CHLAMYDIA PROBE AMP (~~LOC~~) NOT AT ARMC
Chlamydia: NEGATIVE
Comment: NEGATIVE
Comment: NORMAL
Neisseria Gonorrhea: NEGATIVE

## 2022-08-20 LAB — CULTURE, BETA STREP (GROUP B ONLY): Strep Gp B Culture: NEGATIVE

## 2022-08-21 ENCOUNTER — Ambulatory Visit: Payer: Medicaid Other | Admitting: *Deleted

## 2022-08-21 ENCOUNTER — Inpatient Hospital Stay (HOSPITAL_COMMUNITY)
Admission: AD | Admit: 2022-08-21 | Discharge: 2022-08-23 | DRG: 807 | Disposition: A | Discharge status: Home / Self Care | Payer: Medicaid Other | Attending: Obstetrics and Gynecology | Admitting: Obstetrics and Gynecology

## 2022-08-21 ENCOUNTER — Encounter: Payer: Self-pay | Admitting: Family Medicine

## 2022-08-21 ENCOUNTER — Other Ambulatory Visit: Payer: Self-pay

## 2022-08-21 ENCOUNTER — Encounter (HOSPITAL_COMMUNITY): Payer: Self-pay | Admitting: Obstetrics and Gynecology

## 2022-08-21 ENCOUNTER — Ambulatory Visit (INDEPENDENT_AMBULATORY_CARE_PROVIDER_SITE_OTHER): Payer: Medicaid Other

## 2022-08-21 ENCOUNTER — Ambulatory Visit (INDEPENDENT_AMBULATORY_CARE_PROVIDER_SITE_OTHER): Payer: Medicaid Other | Admitting: Family Medicine

## 2022-08-21 VITALS — BP 127/82 | HR 85 | Wt 172.6 lb

## 2022-08-21 DIAGNOSIS — Z3A38 38 weeks gestation of pregnancy: Secondary | ICD-10-CM

## 2022-08-21 DIAGNOSIS — O24424 Gestational diabetes mellitus in childbirth, insulin controlled: Secondary | ICD-10-CM | POA: Diagnosis not present

## 2022-08-21 DIAGNOSIS — Z3493 Encounter for supervision of normal pregnancy, unspecified, third trimester: Principal | ICD-10-CM

## 2022-08-21 DIAGNOSIS — O4103X0 Oligohydramnios, third trimester, not applicable or unspecified: Principal | ICD-10-CM | POA: Diagnosis present

## 2022-08-21 DIAGNOSIS — O24414 Gestational diabetes mellitus in pregnancy, insulin controlled: Secondary | ICD-10-CM

## 2022-08-21 DIAGNOSIS — O9902 Anemia complicating childbirth: Secondary | ICD-10-CM | POA: Diagnosis not present

## 2022-08-21 DIAGNOSIS — Z148 Genetic carrier of other disease: Secondary | ICD-10-CM | POA: Diagnosis not present

## 2022-08-21 DIAGNOSIS — Z789 Other specified health status: Secondary | ICD-10-CM

## 2022-08-21 DIAGNOSIS — Z3492 Encounter for supervision of normal pregnancy, unspecified, second trimester: Secondary | ICD-10-CM

## 2022-08-21 DIAGNOSIS — Z98891 History of uterine scar from previous surgery: Secondary | ICD-10-CM

## 2022-08-21 DIAGNOSIS — O479 False labor, unspecified: Secondary | ICD-10-CM | POA: Diagnosis present

## 2022-08-21 DIAGNOSIS — O34219 Maternal care for unspecified type scar from previous cesarean delivery: Secondary | ICD-10-CM | POA: Insufficient documentation

## 2022-08-21 DIAGNOSIS — O34211 Maternal care for low transverse scar from previous cesarean delivery: Secondary | ICD-10-CM | POA: Diagnosis not present

## 2022-08-21 HISTORY — DX: Gestational diabetes mellitus in pregnancy, unspecified control: O24.419

## 2022-08-21 LAB — GLUCOSE, CAPILLARY
Glucose-Capillary: 84 mg/dL (ref 70–99)
Glucose-Capillary: 85 mg/dL (ref 70–99)

## 2022-08-21 LAB — CBC
HCT: 33.6 % — ABNORMAL LOW (ref 36.0–46.0)
Hemoglobin: 10.5 g/dL — ABNORMAL LOW (ref 12.0–15.0)
MCH: 23 pg — ABNORMAL LOW (ref 26.0–34.0)
MCHC: 31.3 g/dL (ref 30.0–36.0)
MCV: 73.7 fL — ABNORMAL LOW (ref 80.0–100.0)
Platelets: 217 10*3/uL (ref 150–400)
RBC: 4.56 MIL/uL (ref 3.87–5.11)
RDW: 15.2 % (ref 11.5–15.5)
WBC: 8.3 10*3/uL (ref 4.0–10.5)
nRBC: 0 % (ref 0.0–0.2)

## 2022-08-21 MED ORDER — OXYTOCIN BOLUS FROM INFUSION
333.0000 mL | Freq: Once | INTRAVENOUS | Status: AC
  Administered 2022-08-22: 333 mL via INTRAVENOUS

## 2022-08-21 MED ORDER — FLEET ENEMA 7-19 GM/118ML RE ENEM: 1.0000 | ENEMA | RECTAL | Status: DC | PRN

## 2022-08-21 MED ORDER — ACETAMINOPHEN 325 MG PO TABS: 650.0000 mg | ORAL_TABLET | ORAL | Status: DC | PRN

## 2022-08-21 MED ORDER — SOD CITRATE-CITRIC ACID 500-334 MG/5ML PO SOLN: 30.0000 mL | ORAL | Status: DC | PRN

## 2022-08-21 MED ORDER — TERBUTALINE SULFATE 1 MG/ML IJ SOLN: 0.2500 mg | Freq: Once | INTRAMUSCULAR | Status: DC | PRN

## 2022-08-21 MED ORDER — PRENATAL VITAMIN 27-0.8 MG PO TABS: 1.0000 | ORAL_TABLET | Freq: Every day | ORAL | 1 refills | Status: DC

## 2022-08-21 MED ORDER — OXYTOCIN-SODIUM CHLORIDE 30-0.9 UT/500ML-% IV SOLN
1.0000 m[IU]/min | INTRAVENOUS | Status: DC
  Administered 2022-08-21: 1 m[IU]/min via INTRAVENOUS

## 2022-08-21 MED ORDER — LIDOCAINE HCL (PF) 1 % IJ SOLN: 30.0000 mL | INTRAMUSCULAR | Status: DC | PRN

## 2022-08-21 MED ORDER — LACTATED RINGERS IV SOLN
500.0000 mL | INTRAVENOUS | Status: DC | PRN
  Administered 2022-08-22: 500 mL via INTRAVENOUS

## 2022-08-21 MED ORDER — LACTATED RINGERS IV SOLN: INTRAVENOUS | Status: DC

## 2022-08-21 MED ORDER — OXYCODONE-ACETAMINOPHEN 5-325 MG PO TABS: 1.0000 | ORAL_TABLET | ORAL | Status: DC | PRN

## 2022-08-21 MED ORDER — OXYCODONE-ACETAMINOPHEN 5-325 MG PO TABS: 2.0000 | ORAL_TABLET | ORAL | Status: DC | PRN

## 2022-08-21 MED ORDER — ONDANSETRON HCL 4 MG/2ML IJ SOLN: 4.0000 mg | Freq: Four times a day (QID) | INTRAMUSCULAR | Status: DC | PRN

## 2022-08-21 MED ORDER — OXYTOCIN-SODIUM CHLORIDE 30-0.9 UT/500ML-% IV SOLN
2.5000 [IU]/h | INTRAVENOUS | Status: DC
  Administered 2022-08-22: 2.5 [IU]/h via INTRAVENOUS
  Filled 2022-08-21: qty 500

## 2022-08-21 NOTE — MAU Note (Signed)
Pt direct admit from office due to oligohydramnios. Pt desires TOLAC, consent signed

## 2022-08-21 NOTE — Progress Notes (Signed)
   Subjective:  Kelli Christian is a 31 y.o. G4P3003 at [redacted]w[redacted]d being seen today for ongoing prenatal care.  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  Patient reports no complaints.  Contractions: Irritability. Vag. Bleeding: None.  Movement: Present. Denies leaking of fluid.   The following portions of the patient's history were reviewed and updated as appropriate: allergies, current medications, past family history, past medical history, past social history, past surgical history and problem list. Problem list updated.  Objective:   Vitals:   08/21/22 1026  BP: 127/82  Pulse: 85  Weight: 172 lb 9.6 oz (78.3 kg)    Fetal Status: Fetal Heart Rate (bpm): 140   Movement: Present     General:  Alert, oriented and cooperative. Patient is in no acute distress.  Skin: Skin is warm and dry. No rash noted.   Cardiovascular: Normal heart rate noted  Respiratory: Normal respiratory effort, no problems with respiration noted  Abdomen: Soft, gravid, appropriate for gestational age. Pain/Pressure: Present     Pelvic: Vag. Bleeding: None     Cervical exam deferred        Extremities: Normal range of motion.     Mental Status: Normal mood and affect. Normal behavior. Normal judgment and thought content.   Urinalysis:      Assessment and Plan:  Pregnancy: G4P3003 at [redacted]w[redacted]d  1. Encounter for supervision of low-risk pregnancy in second trimester BP and FHR normal - Prenatal Vit-Fe Fumarate-FA (PRENATAL VITAMIN) 27-0.8 MG TABS; Take 1 tablet by mouth daily.  Dispense: 90 tablet; Refill: 1  2. Insulin controlled gestational diabetes mellitus (GDM) in third trimester On metformin 1 g BID, NPH 10u BID On recall sugars have been controlled  Last growth  scan 08/16/2022, EFW 38%, AC 89%, AFI 8.8 Has BPP scheduled for later this morning Scheduled for IOL at 39 weeks on 08/29/2022  3. History of C-section Tolac consent signed previously  4. Language barrier affecting health care [place]  Term labor symptoms and general obstetric precautions including but not limited to vaginal bleeding, contractions, leaking of fluid and fetal movement were reviewed in detail with the patient. Please refer to After Visit Summary for other counseling recommendations.  Return in 7 weeks (on 10/09/2022) for PP check.   Venora Maples, MD

## 2022-08-21 NOTE — Progress Notes (Signed)
Fasting BS this morning- 87 and after meals 118.

## 2022-08-21 NOTE — H&P (Signed)
OBSTETRIC ADMISSION HISTORY AND PHYSICAL  Kelli Christian is a 31 y.o. female [phone] with IUP at 106w0dby 16wk [place] for IOL for oligohydramnios with GDMA2 (metformin and insulin). Patient desires TOLAC.    She reports +FMs, No LOF, no VB, no blurry vision, headaches or peripheral edema, and RUQ pain.  She plans on breast and bottle feeding. She request postplacental IUD for birth control. 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   Alpha thalassemia trait 03/23/2022   Supervision of low-risk pregnancy 02/12/2022   History of C-section 06/11/2019   Language barrier affecting health care 03/05/2016           Nursing Staff Provider  Office Location  [facility] Dating  16 wk [place] PAlexander[[email or web address].[email or web address]] Traditional _1  Centering _2  Mom-Baby Dyad      Language  [name] Anatomy [place] normal  Flu Vaccine  05/24/2022 Genetic/Carrier Screen  NIPS:   Low risk AFP:   Normal  Horizon: silent alpha thal, hgb E  TDaP Vaccine  05/24/2022 Hgb A1C or  GTT Early - normal Third trimester 92/130/133 -GDM  COVID Vaccine     LAB RESULTS   Rhogam  NA Blood Type O/Positive/-- (06/06 1426)   Baby Feeding Plan Both  Antibody Negative (06/06 1426)  Contraception Post placental IUD Rubella 11.10 (06/06 1426)  Circumcision Declined RPR Non Reactive (09/15 0945)   Pediatrician  [facility]  HBsAg Negative (06/06 1426)   Support Person FOB HCVAb neg  Prenatal Classes NA HIV Non Reactive (09/15 0945)     BTL Consent NA GBS Negative/-- (12/08 1048)  VBAC Consent 06/14/22 Pap 03/27/2022 - normal            DME Rx _3  BP cuff _4  Weight Scale Waterbirth  _5  Class _6   Consent _7  CNM visit  PHQ9 & GAD7 [x  ] new OB [ x] 28 weeks  [ x ] 36 weeks Induction  _8  Orders Entered _9 Foley Y/N       Past Medical History: Past Medical History:  Diagnosis Date   Gestational diabetes    Vaginal Pap smear, abnormal     Past Surgical History: Past Surgical History:  Procedure Laterality Date   CESAREAN SECTION N/A 06/09/2019   Procedure: CESAREAN SECTION;  Surgeon: EChancy Milroy MD;  Location: [facility];  Service: Obstetrics;  Laterality: N/A;   LEEP      Obstetrical History: OB History     Gravida  4   Para  3   Term  3   Preterm      AB      Living  3      SAB      IAB      Ectopic      Multiple  0   Live Births  3           Social History Social History   Socioeconomic History   Marital status: Married    Spouse name: Not on file   Number of children: Not on file   Years of education: Not  on file   Highest education level: Not on file  Occupational History   Not on file  Tobacco Use   Smoking status: Never   Smokeless tobacco: Never  Vaping Use   Vaping Use: Never used  Substance and Sexual Activity   Alcohol use: No   Drug use: No   Sexual activity: Yes    Birth control/protection: None  Other Topics Concern   Not on file  Social History Narrative   Not on file   Social Determinants of Health   Financial Resource Strain: Not on file  Food Insecurity: Not on file  Transportation Needs: Not on file  Physical Activity: Not on file  Stress: Not on file  Social Connections: Not on file    Family History: Family History  Problem Relation Age of Onset   Alcohol abuse Neg Hx    Arthritis Neg Hx    Asthma Neg Hx    Birth defects Neg Hx    Cancer Neg Hx    COPD Neg Hx    Depression Neg Hx    Diabetes Neg Hx    Drug abuse Neg Hx    Early death Neg Hx    Hearing loss Neg Hx    Heart disease Neg Hx    Hyperlipidemia Neg Hx    Hypertension Neg Hx    Kidney disease Neg Hx    Learning  disabilities Neg Hx    Mental illness Neg Hx    Mental retardation Neg Hx    Miscarriages / Stillbirths Neg Hx    Stroke Neg Hx    Vision loss Neg Hx    Varicose Veins Neg Hx     Allergies: No Known Allergies  Medications Prior to Admission  Medication Sig Dispense Refill Last Dose   Accu-Chek Softclix Lancets lancets Use as instructed QID 100 each 12 08/21/2022   Blood Glucose Monitoring Suppl (ACCU-CHEK GUIDE) w/Device KIT 1 Device by Does not apply route in the morning, at noon, in the evening, and at bedtime. 1 kit 0 08/21/2022   glucose blood test strip Use as instructed 100 each 12 08/21/2022   insulin NPH Human (NOVOLIN N) 100 UNIT/ML injection Inject 0.1 mLs (10 Units total) into the skin 2 (two) times daily before a meal. 10 mL 3 08/21/2022 at 0700   Insulin Syringe-Needle U-100 (INSULIN SYRINGE .5CC/30GX5/16") 30G X 5/16" 0.5 ML MISC 1 Syringe by Does not apply route 2 (two) times daily. 100 each 11 08/21/2022   metFORMIN (GLUCOPHAGE) 500 MG tablet Take 2 tablets (1,000 mg total) by mouth 2 (two) times daily with a meal. 1 tab by mouth with breakfast and dinner. 60 tablet 5 08/21/2022 at 0700   Prenatal Vit-Fe Fumarate-FA (PRENATAL VITAMIN) 27-0.8 MG TABS Take 1 tablet by mouth daily. 90 tablet 1 08/21/2022   Acetaminophen (TYLENOL PO) Take by mouth.      TRUEPLUS INSULIN SYRINGE 31G X 5/16" 1 ML MISC 2 (two) times daily.        Review of Systems   All systems reviewed and negative except as stated in HPI  Blood pressure 120/78, pulse 68, temperature 98 F (36.7 C), temperature source Oral, resp. rate 16, height _0  (1.651 m), weight 78 kg, last menstrual period 11/13/2021, SpO2 99 %, currently breastfeeding.  General appearance: alert, cooperative, and appears stated age Lungs: easy work of breathing Abdomen: soft, non-tender; gravid Extremities: Homans sign is negative, no sign of DVT Presentation: cephalic  Fetal monitoring: 130bpm, moderate variability, +accels,  no decels. Uterine activity: contractions every 6-10 mins Dilation: 1 Effacement (%): 60 Station: -2 Exam by:: Glendell Docker, MD  Prenatal labs: ABO, Rh: --/--/O POS (12/13 1642) Antibody: NEG (12/13 1642) Rubella: 11.10 (06/06 1426) RPR: Non Reactive (09/15 0945)  HBsAg: Negative (06/06 1426)  HIV: Non Reactive (09/15 0945)  GBS: Negative/-- (12/08 1048)  2 hr Glucola GDMA2 - metformin, NPH insulin Genetic screening silent alpha thal carrier, Hbg E Anatomy US normal  Prenatal Transfer Tool  Maternal Diabetes: Yes:  Diabetes Type:  Insulin/Medication controlled Genetic Screening: Abnormal:  Results: Other:silent alpha thal carrier, Hbg E Maternal Ultrasounds/Referrals: Other: oligohydramnios Fetal Ultrasounds or other Referrals:  None Maternal Substance Abuse:  No Significant Maternal Medications:  Meds include: Other:  metformin, NPH insulin Significant Maternal Lab Results:  Group B Strep negative Number of Prenatal Visits:greater than 3 verified prenatal visits Other Comments:  None  Results for orders placed or performed during the hospital encounter of 08/21/22 (from the past 24 hour(s))  Glucose, capillary   Collection Time: 08/21/22  4:33 PM  Result Value Ref Range   Glucose-Capillary 84 70 - 99 mg/dL   Comment 1 Notify RN   CBC   Collection Time: 08/21/22  4:42 PM  Result Value Ref Range   WBC 8.3 4.0 - 10.5 K/uL   RBC 4.56 3.87 - 5.11 MIL/uL   Hemoglobin 10.5 (L) 12.0 - 15.0 g/dL   HCT 33.6 (L) 36.0 - 46.0 %   MCV 73.7 (L) 80.0 - 100.0 fL   MCH 23.0 (L) 26.0 - 34.0 pg   MCHC 31.3 30.0 - 36.0 g/dL   RDW 15.2 11.5 - 15.5 %   Platelets 217 150 - 400 K/uL   nRBC 0.0 0.0 - 0.2 %  Type and screen [place]   Collection Time: 08/21/22  4:42 PM  Result Value Ref Range   ABO/RH(D) O POS    Antibody Screen NEG    Sample Expiration      08/24/2022,2359 Performed at [facility], [place] [address] [place], [place] [address]   Glucose,  capillary   Collection Time: 08/21/22 11:40 PM  Result Value Ref Range   Glucose-Capillary 85 70 - 99 mg/dL    Patient Active Problem List   Diagnosis Date Noted   Uterine contractions 08/21/2022   Gestational diabetes 05/26/2022   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 G4P3003 at 72w0dhere for IOL for A2 GDM, controlled with insulin and metformin. She has had a previous C-section for breech presentation, 2 prior normal vaginal deliveries before then.  #Labor:admit for IOL. FB placed. Start low dose pitocin, max at 6 units, until FB is ouit. #Pain: IV fentanyl, epidural per patient's preference #FWB: Category 1 #ID:  GBS negative #MOF: both #MOC: pp IUD #Circ:  no  CLiliane ChannelMD MPH OB Fellow, [place]12/14/2023

## 2022-08-22 ENCOUNTER — Inpatient Hospital Stay (HOSPITAL_COMMUNITY): Payer: Medicaid Other | Admitting: Anesthesiology

## 2022-08-22 ENCOUNTER — Encounter (HOSPITAL_COMMUNITY): Payer: Self-pay | Admitting: Family Medicine

## 2022-08-22 DIAGNOSIS — O24424 Gestational diabetes mellitus in childbirth, insulin controlled: Secondary | ICD-10-CM | POA: Diagnosis not present

## 2022-08-22 DIAGNOSIS — Z3A38 38 weeks gestation of pregnancy: Secondary | ICD-10-CM | POA: Diagnosis not present

## 2022-08-22 DIAGNOSIS — O34219 Maternal care for unspecified type scar from previous cesarean delivery: Secondary | ICD-10-CM | POA: Diagnosis not present

## 2022-08-22 DIAGNOSIS — O4103X0 Oligohydramnios, third trimester, not applicable or unspecified: Secondary | ICD-10-CM | POA: Diagnosis not present

## 2022-08-22 DIAGNOSIS — O34211 Maternal care for low transverse scar from previous cesarean delivery: Secondary | ICD-10-CM | POA: Diagnosis not present

## 2022-08-22 LAB — TYPE AND SCREEN
ABO/RH(D): O POS
Antibody Screen: NEGATIVE

## 2022-08-22 LAB — GLUCOSE, CAPILLARY
Glucose-Capillary: 85 mg/dL (ref 70–99)
Glucose-Capillary: 70 mg/dL (ref 70–99)
Glucose-Capillary: 84 mg/dL (ref 70–99)

## 2022-08-22 LAB — RPR: RPR Ser Ql: NONREACTIVE

## 2022-08-22 MED ORDER — PHENYLEPHRINE 80 MCG/ML (10ML) SYRINGE FOR IV PUSH (FOR BLOOD PRESSURE SUPPORT)
80.0000 ug | PREFILLED_SYRINGE | INTRAVENOUS | Status: DC | PRN
  Filled 2022-08-22: qty 10

## 2022-08-22 MED ORDER — DIPHENHYDRAMINE HCL 50 MG/ML IJ SOLN: 12.5000 mg | INTRAMUSCULAR | Status: DC | PRN

## 2022-08-22 MED ORDER — PARAGARD INTRAUTERINE COPPER IU IUD: 1.0000 | INTRAUTERINE_SYSTEM | Freq: Once | INTRAUTERINE | Status: DC

## 2022-08-22 MED ORDER — COCONUT OIL OIL: 1.0000 | TOPICAL_OIL | Status: DC | PRN

## 2022-08-22 MED ORDER — EPHEDRINE 5 MG/ML INJ: 10.0000 mg | INTRAVENOUS | Status: DC | PRN

## 2022-08-22 MED ORDER — FENTANYL-BUPIVACAINE-NACL 0.5-0.125-0.9 MG/250ML-% EP SOLN
12.0000 mL/h | EPIDURAL | Status: DC | PRN
  Filled 2022-08-22: qty 250

## 2022-08-22 MED ORDER — ONDANSETRON HCL 4 MG/2ML IJ SOLN: 4.0000 mg | INTRAMUSCULAR | Status: DC | PRN

## 2022-08-22 MED ORDER — FENTANYL CITRATE (PF) 100 MCG/2ML IJ SOLN
INTRAMUSCULAR | Status: AC
  Filled 2022-08-22: qty 2

## 2022-08-22 MED ORDER — PHENYLEPHRINE 80 MCG/ML (10ML) SYRINGE FOR IV PUSH (FOR BLOOD PRESSURE SUPPORT): 80.0000 ug | PREFILLED_SYRINGE | INTRAVENOUS | Status: DC | PRN

## 2022-08-22 MED ORDER — DIPHENHYDRAMINE HCL 25 MG PO CAPS: 25.0000 mg | ORAL_CAPSULE | Freq: Four times a day (QID) | ORAL | Status: DC | PRN

## 2022-08-22 MED ORDER — BENZOCAINE-MENTHOL 20-0.5 % EX AERO: 1.0000 | INHALATION_SPRAY | CUTANEOUS | Status: DC | PRN

## 2022-08-22 MED ORDER — ZOLPIDEM TARTRATE 5 MG PO TABS: 5.0000 mg | ORAL_TABLET | Freq: Every evening | ORAL | Status: DC | PRN

## 2022-08-22 MED ORDER — IBUPROFEN 600 MG PO TABS
600.0000 mg | ORAL_TABLET | Freq: Four times a day (QID) | ORAL | Status: DC
  Administered 2022-08-22 – 2022-08-23 (×4): 600 mg via ORAL
  Filled 2022-08-22 (×4): qty 1

## 2022-08-22 MED ORDER — FENTANYL CITRATE (PF) 100 MCG/2ML IJ SOLN
100.0000 ug | INTRAMUSCULAR | Status: DC | PRN
  Administered 2022-08-22: 100 ug via INTRAVENOUS

## 2022-08-22 MED ORDER — WITCH HAZEL-GLYCERIN EX PADS: 1.0000 | MEDICATED_PAD | CUTANEOUS | Status: DC | PRN

## 2022-08-22 MED ORDER — SIMETHICONE 80 MG PO CHEW: 80.0000 mg | CHEWABLE_TABLET | ORAL | Status: DC | PRN

## 2022-08-22 MED ORDER — SODIUM BICARBONATE 8.4 % IV SOLN
INTRAVENOUS | Status: DC | PRN
  Administered 2022-08-22: 5 mL via EPIDURAL

## 2022-08-22 MED ORDER — FENTANYL-BUPIVACAINE-NACL 0.5-0.125-0.9 MG/250ML-% EP SOLN
EPIDURAL | Status: DC | PRN
  Administered 2022-08-22: 12 mL/h via EPIDURAL

## 2022-08-22 MED ORDER — DIBUCAINE (PERIANAL) 1 % EX OINT: 1.0000 | TOPICAL_OINTMENT | CUTANEOUS | Status: DC | PRN

## 2022-08-22 MED ORDER — OXYTOCIN-SODIUM CHLORIDE 30-0.9 UT/500ML-% IV SOLN: 1.0000 m[IU]/min | INTRAVENOUS | Status: DC

## 2022-08-22 MED ORDER — SENNOSIDES-DOCUSATE SODIUM 8.6-50 MG PO TABS
2.0000 | ORAL_TABLET | Freq: Every day | ORAL | Status: DC
  Administered 2022-08-23: 2 via ORAL
  Filled 2022-08-22: qty 2

## 2022-08-22 MED ORDER — TETANUS-DIPHTH-ACELL PERTUSSIS 5-2.5-18.5 LF-MCG/0.5 IM SUSY: 0.5000 mL | PREFILLED_SYRINGE | Freq: Once | INTRAMUSCULAR | Status: DC

## 2022-08-22 MED ORDER — LACTATED RINGERS IV SOLN
500.0000 mL | Freq: Once | INTRAVENOUS | Status: AC
  Administered 2022-08-22: 500 mL via INTRAVENOUS

## 2022-08-22 MED ORDER — ACETAMINOPHEN 325 MG PO TABS: 650.0000 mg | ORAL_TABLET | ORAL | Status: DC | PRN

## 2022-08-22 MED ORDER — LIDOCAINE HCL (PF) 1 % IJ SOLN
INTRAMUSCULAR | Status: DC | PRN
  Administered 2022-08-22: 10 mL via EPIDURAL
  Administered 2022-08-22: 2 mL via EPIDURAL

## 2022-08-22 MED ORDER — PRENATAL MULTIVITAMIN CH
1.0000 | ORAL_TABLET | Freq: Every day | ORAL | Status: DC
  Administered 2022-08-23: 1 via ORAL
  Filled 2022-08-22: qty 1

## 2022-08-22 MED ORDER — ONDANSETRON HCL 4 MG PO TABS: 4.0000 mg | ORAL_TABLET | ORAL | Status: DC | PRN

## 2022-08-22 NOTE — Anesthesia Postprocedure Evaluation (Signed)
Anesthesia Post Note  Patient: Kelli Christian  Procedure(s) Performed: AN AD HOC LABOR EPIDURAL     Patient location during evaluation: Mother Baby Anesthesia Type: Epidural Level of consciousness: awake Pain management: satisfactory to patient Vital Signs Assessment: post-procedure vital signs reviewed and stable Respiratory status: spontaneous breathing Cardiovascular status: stable Anesthetic complications: no   No notable events documented.  Last Vitals:  Vitals:   08/22/22 1415 08/22/22 1545  BP: 112/76 104/67  Pulse: 75 84  Resp: 16 16  Temp: 36.9 C 36.7 C  SpO2: 99% 99%    Last Pain:  Vitals:   08/22/22 1739  TempSrc:   PainSc: 0-No pain   Pain Goal:             Report via Kelli Gala, RN      Kelli Christian

## 2022-08-22 NOTE — Anesthesia Preprocedure Evaluation (Signed)
Anesthesia Evaluation  Patient identified by MRN, date of birth, ID band Patient awake    Reviewed: Allergy & Precautions, Patient's Chart, lab work & pertinent test results  Airway Mallampati: II  TM Distance: >3 FB Neck ROM: Full    Dental no notable dental hx.    Pulmonary neg pulmonary ROS   Pulmonary exam normal breath sounds clear to auscultation       Cardiovascular negative cardio ROS Normal cardiovascular exam Rhythm:Regular Rate:Normal     Neuro/Psych negative neurological ROS  negative psych ROS   GI/Hepatic negative GI ROS, Neg liver ROS,,,  Endo/Other  diabetes, Well Controlled, Gestational, Insulin Dependent    Renal/GU negative Renal ROS  negative genitourinary   Musculoskeletal negative musculoskeletal ROS (+)    Abdominal   Peds negative pediatric ROS (+)  Hematology  (+) Blood dyscrasia, anemia Hb 10.5, plt 217   Anesthesia Other Findings   Reproductive/Obstetrics (+) Pregnancy TOLAC                             Anesthesia Physical Anesthesia Plan  ASA: 3  Anesthesia Plan: Epidural   Post-op Pain Management:    Induction:   PONV Risk Score and Plan: 2  Airway Management Planned: Natural Airway  Additional Equipment: None  Intra-op Plan:   Post-operative Plan:   Informed Consent: I have reviewed the patients History and Physical, chart, labs and discussed the procedure including the risks, benefits and alternatives for the proposed anesthesia with the patient or authorized representative who has indicated his/her understanding and acceptance.     Interpreter used for [employer] Discussed with:   Anesthesia Plan Comments:        Anesthesia Quick Evaluation

## 2022-08-22 NOTE — Discharge Summary (Addendum)
Postpartum Discharge Summary  Date of Service updated     Patient Name: Kelli Christian DOB: September 18, 1990 MRN: [id number]  Date of admission: 08/21/2022 Delivery date:08/22/2022  Delivering provider: Gerlene Fee  Date of discharge: 08/23/2022  Admitting diagnosis: Uterine contractions [O47.9] Intrauterine pregnancy: [redacted]w[redacted]d    Secondary diagnosis:  Principal Problem:   Uterine contractions Active Problems:   VBAC (vaginal birth after Cesarean)  Additional problems: none    Discharge diagnosis: Term Pregnancy Delivered and VBAC                                              Post partum procedures: none Augmentation: AROM, Pitocin, and IP Foley Complications: None  Hospital course: Induction of Labor With Vaginal Delivery   31y.o. yo [phone] 39w1das admitted to the hospital 08/21/2022 for induction of labor.  Indication for induction: A2 DM.  Patient had an labor course that was uncomplicated. Membrane Rupture Time/Date: 3:40 AM ,08/22/2022   Delivery Method:Vaginal, Spontaneous  Episiotomy: None  Lacerations:  2nd degree;Perineal  Details of delivery can be found in separate delivery note.  Patient had a postpartum course complicated by nothing. Patient is discharged home 08/23/22.  Newborn Data: Birth date:08/22/2022  Birth time:11:47 AM  Gender:Female  Living status:Living  Apgars:8 ,9  Weight:3180 g   Magnesium Sulfate received: No BMZ received: No Rhophylac:No MMR:N/A T-DaP:Given prenatally Flu: Yes Transfusion:No  Physical exam  Vitals:   08/22/22 1545 08/22/22 1950 08/22/22 2345 08/23/22 0400  BP: 104/67 (!) 97/56 119/75 115/77  Pulse: 84 84 85 76  Resp: _0 Temp: 98 F (36.7 C) 98 F (36.7 C) 97.7 F (36.5 C) 97.6 F (36.4 C)  TempSrc: Oral Oral Oral Oral  SpO2: 99% 99% 99% 100%  Weight:      Height:       General: alert, cooperative, and no distress Lochia: appropriate Uterine Fundus: firm Incision: N/A DVT Evaluation: No  evidence of DVT seen on physical exam. No cords or calf tenderness. Labs: Lab Results  Component Value Date   WBC 14.7 (H) 08/23/2022   HGB 8.4 (L) 08/23/2022   HCT 25.8 (L) 08/23/2022   MCV 72.7 (L) 08/23/2022   PLT 193 08/23/2022      Latest Ref Rng & Units 07/12/2022   10:53 AM  CMP  Glucose 70 - 99 mg/dL 82   BUN 6 - 20 mg/dL 6   Creatinine 0.57 - 1.00 mg/dL 0.36   Sodium 134 - 144 mmol/L 136   Potassium 3.5 - 5.2 mmol/L 3.4   Chloride 96 - 106 mmol/L 103   CO2 20 - 29 mmol/L 21   Calcium 8.7 - 10.2 mg/dL 8.7    Edinburgh Score:    08/23/2022    4:00 AM  Edinburgh Postnatal Depression Scale Screening Tool  I have been able to laugh and see the funny side of things. 0  I have looked forward with enjoyment to things. 0  I have blamed myself unnecessarily when things went wrong. 2  I have been anxious or worried for no good reason. 2  I have felt scared or panicky for no good reason. 1  Things have been getting on top of me. 1  I have been so unhappy that I have had difficulty sleeping. 0  I have felt sad or miserable. 1  I have been so unhappy that I have been crying. 0  The thought of harming myself has occurred to me. 0  Edinburgh Postnatal Depression Scale Total 7    After visit meds:  Allergies as of 08/23/2022   No Known Allergies      Medication List     STOP taking these medications    Accu-Chek Guide w/Device Kit   Accu-Chek Softclix Lancets lancets   glucose blood test strip   insulin NPH Human 100 UNIT/ML injection Commonly known as: NOVOLIN N   INSULIN SYRINGE .5CC/30GX5/16" 30G X 5/16" 0.5 ML Misc   metFORMIN 500 MG tablet Commonly known as: GLUCOPHAGE   TRUEplus Insulin Syringe 31G X 5/16" 1 ML Misc Generic drug: Insulin Syringe-Needle U-100       TAKE these medications    acetaminophen 325 MG tablet Commonly known as: Tylenol Take 2 tablets (650 mg total) by mouth every 4 (four) hours as needed (for pain scale < 4). What  changed:  medication strength how much to take when to take this reasons to take this   benzocaine-Menthol 20-0.5 % Aero Commonly known as: DERMOPLAST Apply 1 Application topically as needed for irritation (perineal discomfort).   coconut oil Oil Apply 1 Application topically as needed.   ferrous sulfate 325 (65 FE) MG tablet Take 1 tablet (325 mg total) by mouth every other day.   ibuprofen 600 MG tablet Commonly known as: ADVIL Take 1 tablet (600 mg total) by mouth every 6 (six) hours.   Prenatal Vitamin 27-0.8 MG Tabs Take 1 tablet by mouth daily.   senna-docusate 8.6-50 MG tablet Commonly known as: Senokot-S Take 2 tablets by mouth daily.        Discharge home in stable condition Infant Feeding: Bottle and Breast Infant Disposition:home with mother Discharge instruction: per After Visit Summary and Postpartum booklet. Activity: Advance as tolerated. Pelvic rest for 6 weeks.  Diet: carb modified diet Future Appointments: Future Appointments  Date Time Provider [place]  10/04/2022  8:50 AM [facility]  10/04/2022 10:15 AM Radene Gunning, MD [facility]   Follow up Visit:  Message sent to [facility] by [place] on 08/23/2022  Please schedule this patient for a In person postpartum visit in 6 weeks with the following provider: MD and APP. Additional Postpartum F/U:2 hour GTT  High risk pregnancy complicated by: GDM Delivery mode:  Vaginal, Spontaneous  Anticipated Birth Control: paraguard IUD outpatient - received depo inpatient  08/23/2022 Jiyun N. Radene Knee, MD  GME ATTESTATION:  I saw and evaluated the patient. I agree with the findings and the plan of care as documented in the resident's note. I have made changes to documentation as necessary.  Gerlene Fee, DO OB Fellow, [facility] for [place] 08/23/2022, 8:42 AM

## 2022-08-22 NOTE — Anesthesia Procedure Notes (Signed)
Epidural Patient location during procedure: OB Start time: 08/22/2022 2:56 AM End time: 08/22/2022 3:09 AM  Staffing Anesthesiologist: Lannie Fields, DO Performed: anesthesiologist   Preanesthetic Checklist Completed: patient identified, IV checked, risks and benefits discussed, monitors and equipment checked, pre-op evaluation and timeout performed  Epidural Patient position: sitting Prep: DuraPrep and site prepped and draped Patient monitoring: continuous pulse ox, blood pressure, heart rate and cardiac monitor Approach: midline Location: L3-L4 Injection technique: LOR air  Needle:  Needle type: Tuohy  Needle gauge: 17 G Needle length: 9 cm Needle insertion depth: 5.5 cm Catheter type: closed end flexible Catheter size: 19 Gauge Catheter at skin depth: 11 cm Test dose: negative  Assessment Sensory level: T8 Events: blood not aspirated, no cerebrospinal fluid, injection not painful, no injection resistance, no paresthesia and negative IV test  Additional Notes Patient identified. Risks/Benefits/Options discussed with patient including but not limited to bleeding, infection, nerve damage, paralysis, failed block, incomplete pain control, headache, blood pressure changes, nausea, vomiting, reactions to medication both or allergic, itching and postpartum back pain. Confirmed with bedside nurse the patient's most recent platelet count. Confirmed with patient that they are not currently taking any anticoagulation, have any bleeding history or any family history of bleeding disorders. Patient expressed understanding and wished to proceed. All questions were answered. Sterile technique was used throughout the entire procedure. Please see nursing notes for vital signs. Test dose was given through epidural catheter and negative prior to continuing to dose epidural or start infusion. Warning signs of high block given to the patient including shortness of breath, tingling/numbness in  hands, complete motor block, or any concerning symptoms with instructions to call for help. Patient was given instructions on fall risk and not to get out of bed. All questions and concerns addressed with instructions to call with any issues or inadequate analgesia.  Reason for block:procedure for pain

## 2022-08-22 NOTE — Progress Notes (Signed)
Labor Progress Note Sarenity Zirkelbach is a 31 y.o. [id number] at [redacted]w[redacted]d presented for IOL for oligohydramnios S: Patient complaining of significant R-sided abdominal pain that comes and goes.  O:  BP 117/74   Pulse 75   Temp 97.8 F (36.6 C) (Oral)   Resp 16   Ht 5\' 5"  (1.651 m)   Wt 78 kg   LMP 11/13/2021   SpO2 98%   BMI 28.62 kg/m  EFM: 130bpm/mod var/+15x15 accels, no decels  CVE: Dilation: 8.5 Effacement (%): 90 Station: -2 Presentation: Vertex Exam by:: [email or web address], RN   A&P: 31 y.o. 38 [redacted]w[redacted]d here for IOL for oligohydramnios. Desires TOLAC. Hx of VAVDx2 and episotomy. Last pregnancy LTCS. Now s/p AROM and FB. #Labor: Continue pitocin.  #Pain: Epidural placed. #FWB: Category 1 #GBS negative  #Abdominal pain: epidural in place and s/p 3 pushes of button. Still having significant pain, however, no acute abdomen, unlikely to be uterine rupture. No loss of station. Pain comes and goes - consistent with contraction pattern. Also, FHT category 1. Will discuss with anesthesiology.   Sabrin Dunlevy N. [redacted]w[redacted]d, MD 10:25 AM

## 2022-08-23 LAB — CBC
HCT: 25.8 % — ABNORMAL LOW (ref 36.0–46.0)
Hemoglobin: 8.4 g/dL — ABNORMAL LOW (ref 12.0–15.0)
MCH: 23.7 pg — ABNORMAL LOW (ref 26.0–34.0)
MCHC: 32.6 g/dL (ref 30.0–36.0)
MCV: 72.7 fL — ABNORMAL LOW (ref 80.0–100.0)
Platelets: 193 10*3/uL (ref 150–400)
RBC: 3.55 MIL/uL — ABNORMAL LOW (ref 3.87–5.11)
RDW: 15 % (ref 11.5–15.5)
WBC: 14.7 10*3/uL — ABNORMAL HIGH (ref 4.0–10.5)
nRBC: 0 % (ref 0.0–0.2)

## 2022-08-23 LAB — GLUCOSE, CAPILLARY
Glucose-Capillary: 127 mg/dL — ABNORMAL HIGH (ref 70–99)
Glucose-Capillary: 91 mg/dL (ref 70–99)

## 2022-08-23 MED ORDER — FERROUS SULFATE 325 (65 FE) MG PO TABS
325.0000 mg | ORAL_TABLET | ORAL | Status: DC
  Administered 2022-08-23: 325 mg via ORAL
  Filled 2022-08-23: qty 1

## 2022-08-23 MED ORDER — SENNOSIDES-DOCUSATE SODIUM 8.6-50 MG PO TABS: 2.0000 | ORAL_TABLET | Freq: Every day | ORAL | 0 refills | Status: AC

## 2022-08-23 MED ORDER — ACETAMINOPHEN 325 MG PO TABS: 650.0000 mg | ORAL_TABLET | ORAL | Status: DC | PRN

## 2022-08-23 MED ORDER — MEDROXYPROGESTERONE ACETATE 150 MG/ML IM SUSP
150.0000 mg | Freq: Once | INTRAMUSCULAR | Status: AC
  Administered 2022-08-23: 150 mg via INTRAMUSCULAR
  Filled 2022-08-23: qty 1

## 2022-08-23 MED ORDER — BENZOCAINE-MENTHOL 20-0.5 % EX AERO: 1.0000 | INHALATION_SPRAY | CUTANEOUS | 0 refills | Status: DC | PRN

## 2022-08-23 MED ORDER — IBUPROFEN 600 MG PO TABS: 600.0000 mg | ORAL_TABLET | Freq: Four times a day (QID) | ORAL | 0 refills | Status: DC

## 2022-08-23 MED ORDER — FERROUS SULFATE 325 (65 FE) MG PO TABS: 325.0000 mg | ORAL_TABLET | ORAL | 0 refills | Status: DC

## 2022-08-23 MED ORDER — COCONUT OIL OIL: 1.0000 | TOPICAL_OIL | 0 refills | Status: DC | PRN

## 2022-08-23 NOTE — Progress Notes (Addendum)
POSTPARTUM PROGRESS NOTE  Post Partum Day 1  Subjective:  Kelli Christian is a 31 y.o. [id number] s/p SVD at [redacted]w[redacted]d.  No acute events overnight.  Pt denies problems with ambulating, voiding or po intake.  She denies nausea or vomiting.  Pain is moderately controlled - feeling pulling by her stitches.  She has not had bowel movement.  Lochia Small. Not feeling dizzy when getting out of bed  Objective: Blood pressure 115/77, pulse 76, temperature 97.6 F (36.4 C), temperature source Oral, resp. rate 18, height 5\' 5"  (1.651 m), weight 78 kg, last menstrual period 11/13/2021, SpO2 100 %, unknown if currently breastfeeding.  Physical Exam:  General: alert, cooperative and no distress Chest: no respiratory distress Heart:regular rate, distal pulses intact Abdomen: soft, nontender Uterine Fundus: firm, appropriately tender DVT Evaluation: No calf swelling or tenderness Extremities: No edema Skin: warm, dry  Recent Labs    08/21/22 1642 08/23/22 0512  HGB 10.5* 8.4*  HCT 33.6* 25.8*    Assessment/Plan: Kelli Christian is a 31 y.o. 38 s/p SVD at [redacted]w[redacted]d   PPD#1 - Doing well Contraception: depo Feeding: both Dispo: Plan for discharge today vs tomorrow.  Anemia: Hb 11.0>8.4 - no symptoms, start oral iron GDMA1: 127 cbg today - pt reports eating chicken/rice an hour beforehand. Can repeat CBG two hours after eating.   LOS: 2 days   [redacted]w[redacted]d., MD, CNM 08/23/2022, 7:02 AM   GME ATTESTATION:  I saw and evaluated the patient. I agree with the findings and the plan of care as documented in the resident's note. I have made changes to documentation as necessary.  08/25/2022, DO OB Fellow, [facility] 08/23/2022, 8:04 AM

## 2022-08-26 ENCOUNTER — Telehealth: Payer: Self-pay | Admitting: *Deleted

## 2022-08-26 NOTE — Patient Outreach (Signed)
Transitional Care Management Follow-up Telephone Call  Kelli Christian was discharged from [facility] at [facility]), has or will have a pregnancy risk assessment at next visit, has a scheduled  Postpartum  appointment with the [facility] provider on 10/04/22, and has been referred for appropriate case management and ongoing follow up.   Estanislado Emms RN, BSN [place]  [employer]

## 2022-08-27 ENCOUNTER — Encounter: Payer: Medicaid Other | Admitting: Family Medicine

## 2022-08-29 ENCOUNTER — Inpatient Hospital Stay (HOSPITAL_COMMUNITY): Payer: Medicaid Other

## 2022-08-29 ENCOUNTER — Inpatient Hospital Stay (HOSPITAL_COMMUNITY)
Admission: AD | Admit: 2022-08-29 | Payer: Medicaid Other | Source: Home / Self Care | Admitting: Obstetrics and Gynecology

## 2022-09-03 ENCOUNTER — Telehealth (HOSPITAL_COMMUNITY): Payer: Self-pay | Admitting: *Deleted

## 2022-09-03 NOTE — Telephone Encounter (Signed)
Hospital Discharge Follow-Up Call:  Spoke to patient with help of telephonic interpreter "Harriett Sine [phone]".  Patient reports that she is doing well and she has no concerns about her healing process.  EPDS today was 0 and she endorses this accurately reflects that she is doing well emotionally.  Patient says that baby is well and she has no concerns about baby's health.  She reports that baby sleeps in a crib.  Reviewed ABCs of Safe Sleep.

## 2022-10-03 ENCOUNTER — Other Ambulatory Visit: Payer: Self-pay

## 2022-10-03 DIAGNOSIS — Z3493 Encounter for supervision of normal pregnancy, unspecified, third trimester: Secondary | ICD-10-CM

## 2022-10-04 ENCOUNTER — Other Ambulatory Visit: Payer: Medicaid Other

## 2022-10-04 ENCOUNTER — Ambulatory Visit (INDEPENDENT_AMBULATORY_CARE_PROVIDER_SITE_OTHER): Payer: Medicaid Other | Admitting: Obstetrics and Gynecology

## 2022-10-04 ENCOUNTER — Other Ambulatory Visit: Payer: Self-pay

## 2022-10-04 ENCOUNTER — Encounter: Payer: Self-pay | Admitting: Obstetrics and Gynecology

## 2022-10-04 VITALS — BP 124/82 | HR 90 | Wt 161.0 lb

## 2022-10-04 DIAGNOSIS — Z789 Other specified health status: Secondary | ICD-10-CM | POA: Diagnosis not present

## 2022-10-04 DIAGNOSIS — Z3493 Encounter for supervision of normal pregnancy, unspecified, third trimester: Secondary | ICD-10-CM | POA: Diagnosis not present

## 2022-10-04 DIAGNOSIS — O24414 Gestational diabetes mellitus in pregnancy, insulin controlled: Secondary | ICD-10-CM

## 2022-10-04 NOTE — Progress Notes (Signed)
[place] Partum Visit Note  Kelli Christian is a 32 y.o. [phone] female who presents for a postpartum visit. She is 6 weeks postpartum following a normal spontaneous vaginal delivery.  I have fully reviewed the prenatal and intrapartum course. The delivery was at [redacted]w[redacted]d gestational weeks.  Anesthesia: epidural. Postpartum course has been uncomplicated. Baby is doing well. Baby is feeding by bottle -  Similac . Bleeding staining only. Bowel function is normal. Bladder function is normal. Patient is sexually active. Contraception method is Depo-Provera injections. Postpartum depression screening: negative.   The pregnancy intention screening data noted above was reviewed. Potential methods of contraception were discussed. The patient elected to proceed with No data recorded.   Edinburgh Postnatal Depression Scale - 10/04/22 1059       Edinburgh Postnatal Depression Scale:  In the Past 7 Days   I have been able to laugh and see the funny side of things. 0    I have looked forward with enjoyment to things. 0    I have blamed myself unnecessarily when things went wrong. 0    I have been anxious or worried for no good reason. 0    I have felt scared or panicky for no good reason. 0    Things have been getting on top of me. 0    I have been so unhappy that I have had difficulty sleeping. 0    I have felt sad or miserable. 0    I have been so unhappy that I have been crying. 0    The thought of harming myself has occurred to me. 0    Edinburgh Postnatal Depression Scale Total 0             Health Maintenance Due  Topic Date Due   COVID-19 Vaccine (1) Never done    The following portions of the patient's history were reviewed and updated as appropriate: allergies, current medications, past family history, past medical history, past social history, past surgical history, and problem list.  Review of Systems Pertinent items are noted in HPI.  Objective:  BP 124/82   Pulse 90   Wt 161 lb  (73 kg)   Breastfeeding No   BMI 26.79 kg/m    General:  alert, cooperative, and no distress   Breasts:  not indicated  Lungs: Normal effort  Heart:  Regular rate  Abdomen: soft, non-tender; bowel sounds normal; no masses,  no organomegaly   Wound NA  GU exam:  normal       Assessment:    1. Language barrier affecting health care In person interpreter used throughout  2. Postpartum care and examination Normal recovery healing well Would like to continue Depo. No issues with sexual activity  3. Insulin controlled gestational diabetes mellitus (GDM) in third trimester 2 hr today   6 wk postpartum exam.   Plan:   Essential components of care per ACOG recommendations:  1.  Mood and well being: Patient with negative depression screening today. Reviewed local resources for support.  - Patient tobacco use? No.   - hx of drug use? No.    2. Infant care and feeding:  -Patient currently breastmilk feeding? No.  -Social determinants of health (SDOH) reviewed in EPIC. No concerns  3. Sexuality, contraception and birth spacing - Patient does not want a pregnancy in the next year.   - Reviewed reproductive life planning. Reviewed contraceptive methods based on pt preferences and effectiveness.  Pt wishes to continue Depo.  -  Discussed birth spacing of 18 months  4. Sleep and fatigue -Encouraged family/partner/community support of 4 hrs of uninterrupted sleep to help with mood and fatigue  5. Physical Recovery  - Discussed patients delivery and complications. She describes her labor as good. - Patient had a Vaginal, no problems at delivery. Patient had a 2nd degree laceration. Perineal healing reviewed. Patient expressed understanding - Patient has urinary incontinence? No. - Patient is safe to resume physical and sexual activity  6.  Health Maintenance - HM due items addressed Yes - Last pap smear  Diagnosis  Date Value Ref Range Status  03/27/2022   Final   - Negative  for intraepithelial lesion or malignancy (NILM)   Pap smear not done at today's visit.  -Breast Cancer screening indicated? No.   7. Chronic Disease/Pregnancy Condition follow up: Gestational Diabetes - doing 2 hr today.   - PCP follow up  Radene Gunning, [place] [facility], [place]

## 2022-10-05 LAB — GLUCOSE TOLERANCE, 2 HOURS
Glucose, 2 hour: 84 mg/dL (ref 70–139)
Glucose, GTT - Fasting: 77 mg/dL (ref 70–99)

## 2022-10-07 ENCOUNTER — Telehealth: Payer: Self-pay

## 2022-10-07 NOTE — Telephone Encounter (Addendum)
-----  Message from Radene Gunning, MD sent at 10/07/2022 10:49 AM EST ----- Please let her know her 2 hr postpartum was normal.  Thanks, pad  Called pt with [facility] interpreter ID [phone]. Will attempt to contact pt a second time.

## 2022-10-08 NOTE — Telephone Encounter (Signed)
Called pt with [facility] interpreter ID [phone]. Reviewed results.

## 2022-11-11 ENCOUNTER — Ambulatory Visit (INDEPENDENT_AMBULATORY_CARE_PROVIDER_SITE_OTHER): Payer: Medicaid Other | Admitting: *Deleted

## 2022-11-11 ENCOUNTER — Other Ambulatory Visit: Payer: Self-pay

## 2022-11-11 VITALS — BP 111/79 | HR 74 | Ht 63.75 in | Wt 166.3 lb

## 2022-11-11 DIAGNOSIS — Z3009 Encounter for other general counseling and advice on contraception: Secondary | ICD-10-CM

## 2022-11-11 DIAGNOSIS — Z3042 Encounter for surveillance of injectable contraceptive: Secondary | ICD-10-CM

## 2022-11-11 MED ORDER — MEDROXYPROGESTERONE ACETATE 150 MG/ML IM SUSY
150.0000 mg | PREFILLED_SYRINGE | Freq: Once | INTRAMUSCULAR | Status: AC
  Administered 2022-11-11: 150 mg via INTRAMUSCULAR

## 2022-11-11 NOTE — Progress Notes (Signed)
Kelli Christian here for Depo-Provera  Injection.  Injection administered without complication. Patient will return in 3 months for next injection. Last pap was 03/27/22. Last exam was 10/04/22. Last depo-provera was 08/23/22.   Vaughan Basta [facility] 11/11/2022  11:00 AM

## 2022-12-22 ENCOUNTER — Encounter (HOSPITAL_COMMUNITY): Payer: Self-pay | Admitting: Emergency Medicine

## 2022-12-22 ENCOUNTER — Ambulatory Visit (HOSPITAL_COMMUNITY)
Admission: EM | Admit: 2022-12-22 | Discharge: 2022-12-22 | Disposition: A | Discharge status: Home / Self Care | Payer: Medicaid Other | Attending: Emergency Medicine | Admitting: Emergency Medicine

## 2022-12-22 DIAGNOSIS — H1013 Acute atopic conjunctivitis, bilateral: Secondary | ICD-10-CM

## 2022-12-22 MED ORDER — OLOPATADINE HCL 0.1 % OP SOLN: 1.0000 [drp] | Freq: Two times a day (BID) | OPHTHALMIC | 12 refills | Status: DC

## 2022-12-22 MED ORDER — CETIRIZINE HCL 10 MG PO TABS: 10.0000 mg | ORAL_TABLET | Freq: Every day | ORAL | 0 refills | Status: DC

## 2022-12-22 NOTE — Discharge Instructions (Addendum)
Cc tri?u ch?ng c?a b?n ph h?p v?i b?nh vim k?t m?c d? ?ng. ?i?u ny c th? th?c s? t?i t? trong ma ph?n hoa. Hy ??m b?o r?ng b?n r?a tay th??ng xuyn v c? g?ng khng ng?a m?t v ?i?u ny c th? khi?n tnh tr?ng ng?a c?a b?n tr? nn t?i t? h?n. Vui lng s? d?ng thu?c nh? m?t Pataday hai l?n m?i ngy cho c? hai m?t. Ngoi ra, vui lng b?t ??u dng thu?c khng histamine Zyrtec hng ngy, thu?c ny s? gip gi?m cc tri?u ch?ng d? ?ng v ho c?a c ?y.  Vui lng quay l?i phng khm ho?c n?i ch?m [place] chnh c?a b?n n?u b?n c b?t k? tri?u ch?ng m?i ho?c ?ng lo ng?i no.

## 2022-12-22 NOTE — ED Triage Notes (Signed)
Used interpretor  Pt c/o allergies to flowers. Pt c/o itching eyes and rubbing them that has been going on for "many months but today is worse".

## 2022-12-22 NOTE — ED Provider Notes (Signed)
[facility]    CSN: [id number] Arrival date & time: 12/22/22  1530      History   Chief Complaint Chief Complaint  Patient presents with   Eye Problem    HPI Kelli Christian is a 32 y.o. female.   Reports bilateral eye itching that has been ongoing for months but has gotten worse over the past few days.   When she rubs her eyes she occasionally has watery discharge, denies thick crusting discharge.   Does not take any medications for seasonal allergies, does not wear contacts or glasses and denies changes to vision, no pain. Does have a cough that is dry and worse at night.    The history is provided by the patient and medical records. The history is limited by a language barrier. A language interpreter was used.  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 Authorizing Provider  cetirizine (ZYRTEC ALLERGY) 10 MG tablet Take 1 tablet (10 mg total) by mouth daily. 12/22/22 01/21/23 Yes Rinaldo Ratel, [place] N, FNP  olopatadine (PATADAY) 0.1 %  ophthalmic solution Place 1 drop into both eyes 2 (two) times daily. 12/22/22  Yes Rinaldo Ratel, [place] N, FNP  acetaminophen (TYLENOL) 325 MG tablet Take 2 tablets (650 mg total) by mouth every 4 (four) hours as needed (for pain scale < 4). 08/23/22   Esmeralda Arthur., MD  coconut oil OIL Apply 1 Application topically as needed. 08/23/22   Esmeralda Arthur., MD  ibuprofen (ADVIL) 600 MG tablet Take 1 tablet (600 mg total) by mouth every 6 (six) hours. 08/23/22   Esmeralda Arthur., MD    Family History Family History  Problem Relation Age of Onset   Alcohol abuse Neg Hx    Arthritis Neg Hx    Asthma Neg Hx    Birth defects Neg Hx    Cancer Neg Hx    COPD Neg Hx    Depression Neg Hx    Diabetes Neg Hx    Drug abuse Neg Hx    Early death Neg Hx    Hearing loss Neg Hx    Heart disease Neg Hx    Hyperlipidemia Neg Hx    Hypertension Neg Hx    Kidney disease Neg  Hx    Learning disabilities Neg Hx    Mental illness Neg Hx    Mental retardation Neg Hx    Miscarriages / Stillbirths Neg Hx    Stroke Neg Hx    Vision loss Neg Hx    Varicose Veins Neg Hx     Social History Social History   Tobacco Use   Smoking status: Never   Smokeless tobacco: Never  Vaping Use   Vaping Use: Never used  Substance Use Topics   Alcohol use: No   Drug use: No     Allergies   Patient has no known allergies.   Review of Systems Review of Systems  Constitutional:  Negative for chills and fever.  HENT:  Negative for congestion and sore throat.   Eyes:  Positive for discharge, redness and itching. Negative for photophobia, pain and visual disturbance.  Respiratory:  Positive for cough. Negative for shortness of breath.   Cardiovascular:  Negative for chest pain.     Physical Exam Triage Vital Signs ED Triage Vitals [12/22/22 1604]  Enc Vitals Group     BP 128/87     Pulse Rate 85     Resp 17     Temp 98.7 F (37.1 C)     Temp Source Oral     SpO2 97 %     Weight      Height      Head  Circumference      Peak Flow      Pain Score      Pain Loc      Pain Edu?      Excl. in GC?    No data found.  Updated Vital Signs BP 128/87 (BP Location: Right Arm)   Pulse 85   Temp 98.7 F (37.1 C) (Oral)   Resp 17   SpO2 97%   Breastfeeding No   Visual Acuity Right Eye Distance:   Left Eye Distance:   Bilateral Distance:    Right Eye Near:   Left Eye Near:    Bilateral Near:     Physical Exam Vitals and nursing note reviewed.  Constitutional:      Appearance: Normal appearance.  HENT:     Head: Normocephalic and atraumatic.     Right Ear: External ear normal.     Left Ear: External ear normal.     Mouth/Throat:     Mouth: Mucous membranes are moist.  Eyes:     General: Lids are normal.        Right eye: Discharge present.        Left eye: Discharge present.    Conjunctiva/sclera:     Right eye: Right conjunctiva is injected.     Left eye: Left conjunctiva is injected.     Pupils: Pupils are equal, round, and reactive to light.     Comments: Watery discharge present bilaterally.  Cardiovascular:     Rate and Rhythm: Normal rate and regular rhythm.  Pulmonary:     Effort: Pulmonary effort is normal. No respiratory distress.  Skin:    General: Skin is warm and dry.  Neurological:     Mental Status: She is alert and oriented to person, place, and time.  Psychiatric:        Mood and Affect: Mood normal.        Behavior: Behavior is cooperative.      UC Treatments / Results  Labs (all labs ordered are listed, but only abnormal results are displayed)  Labs Reviewed - No data to display  EKG   Radiology No results found.  Procedures Procedures (including critical care time)  Medications Ordered in UC Medications - No data to display  Initial Impression / Assessment and Plan / UC Course  I have reviewed the triage vital signs and the nursing notes.  Pertinent labs & imaging results that were available during my care of the patient were  reviewed by me and considered in my medical decision making (see chart for details).  Vitals in triage reviewed, patient is hemodynamically stable.  Bilateral conjunctival erythema with watery discharge consistent with allergic conjunctivitis.  Placed on Pataday drops and encouraged to start daily antihistamine as this might help with her cough.  Patient verbalized understanding, no questions at this time.  Return and follow-up precautions reviewed.     Final Clinical Impressions(s) / UC Diagnoses   Final diagnoses:  Allergic conjunctivitis of both eyes     Discharge Instructions      Cc tri?u ch?ng c?a b?n ph h?p v?i b?nh vim k?t m?c d? ?ng. ?i?u ny c th? th?c s? t?i t? trong ma ph?n hoa. Hy ??m b?o r?ng b?n r?a tay th??ng xuyn v c? g?ng khng ng?a m?t v ?i?u ny c th? khi?n tnh tr?ng ng?a c?a b?n tr? nn t?i t? h?n. Vui lng s? d?ng thu?c nh? m?t Pataday hai l?n m?i ngy cho c? hai m?t. Ngoi ra, vui lng b?t ??u dng thu?c khng histamine Zyrtec hng ngy, thu?c ny s? gip gi?m cc tri?u ch?ng d? ?ng v ho c?a c ?y.  Vui lng quay l?i phng khm ho?c n?i ch?m [place] chnh c?a b?n n?u b?n c b?t k? tri?u ch?ng m?i ho?c ?ng lo ng?i no.       ED Prescriptions     Medication Sig Dispense Auth. Provider   olopatadine (PATADAY) 0.1 % ophthalmic solution Place 1 drop into both eyes 2 (two) times daily. 5 mL Rinaldo Ratel, [place] N, FNP   cetirizine (ZYRTEC ALLERGY) 10 MG tablet Take 1 tablet (10 mg total) by mouth daily. 30 tablet Kyleah Pensabene, [place] N, [place]      PDMP not reviewed this encounter.   Valisha Heslin, [place] N, [place] 12/22/22 [phone]

## 2022-12-29 ENCOUNTER — Encounter (HOSPITAL_COMMUNITY): Payer: Self-pay

## 2022-12-29 ENCOUNTER — Ambulatory Visit (HOSPITAL_COMMUNITY)
Admission: EM | Admit: 2022-12-29 | Discharge: 2022-12-29 | Disposition: A | Discharge status: Home / Self Care | Payer: Medicaid Other | Attending: Emergency Medicine | Admitting: Emergency Medicine

## 2022-12-29 DIAGNOSIS — H65191 Other acute nonsuppurative otitis media, right ear: Secondary | ICD-10-CM

## 2022-12-29 DIAGNOSIS — J018 Other acute sinusitis: Secondary | ICD-10-CM | POA: Diagnosis not present

## 2022-12-29 MED ORDER — AMOXICILLIN-POT CLAVULANATE 875-125 MG PO TABS: 1.0000 | ORAL_TABLET | Freq: Two times a day (BID) | ORAL | 0 refills | Status: AC

## 2022-12-29 MED ORDER — PROMETHAZINE-DM 6.25-15 MG/5ML PO SYRP: 5.0000 mL | ORAL_SOLUTION | Freq: Four times a day (QID) | ORAL | 0 refills | Status: DC | PRN

## 2022-12-29 NOTE — Discharge Instructions (Addendum)
Please take the antibiotic Augmentin twice daily for the next 5 days.  Take with food to avoid upset stomach.  Make sure you finish the full course.  If you are still having persisting symptoms after finishing this medicine, please return.  You can also take the cough syrup 4 times daily.  If this medication makes you drowsy, take only before bedtime  Vui lng dng thu?c khng sinh Augmentin hai l?n m?i ngy trong 5 ngy ti?p theo. Dng cng v?i th?c ?n ?? trnh ?au b?ng. Hy ch?c ch?n r?ng b?n hon thnh kha h?c ??y ??. N?u b?n v?n cn cc tri?u ch?ng dai d?ng sau khi dng xong thu?c ny, vui lng quay l?i.  B?n c?ng c th? u?ng xi-r ho 4 l?n m?i ngy. N?u thu?c ny lm b?n bu?n ng?, ch? dng tr??c khi ?i ng?

## 2022-12-29 NOTE — ED Provider Notes (Signed)
[facility]    CSN: [id number] Arrival date & time: 12/29/22  1516      History   Chief Complaint Chief Complaint  Patient presents with   Facial Pain    HPI Rionna Hessel is a 32 y.o. female.  Medical interpreter used for this encounter Here for 1 week history of runny nose, sinus congestion and pressure, scratchy throat, dry cough. Also having ear pressure. She was seen last week for watery eyes and started on olopatadine which have helped. No fever or chills. No shortness of breath or wheezing. Denies any known sick contacts. 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 Authorizing Provider  amoxicillin-clavulanate (AUGMENTIN) 875-125 MG tablet Take 1 tablet by mouth 2 (two) times daily for 5 days. 12/29/22 01/03/23 Yes Dhana Totton, Lurena Joiner, PA-C  promethazine-dextromethorphan (PROMETHAZINE-DM) 6.25-15 MG/5ML syrup Take 5 mLs by mouth 4 (four) times daily as needed for cough. 12/29/22  Yes Shemekia Patane, Lurena Joiner, PA-C  cetirizine (ZYRTEC ALLERGY) 10 MG tablet Take 1 tablet (10 mg total) by mouth daily. 12/22/22 01/21/23  Garrison,  [place] N, FNP  olopatadine (PATADAY) 0.1 % ophthalmic solution Place 1 drop into both eyes 2 (two) times daily. 12/22/22   Garrison, [place] N, FNP    Family History Family History  Problem Relation Age of Onset   Alcohol abuse Neg Hx    Arthritis Neg Hx    Asthma Neg Hx    Birth defects Neg Hx    Cancer Neg Hx    COPD Neg Hx    Depression Neg Hx    Diabetes Neg Hx    Drug abuse Neg Hx    Early death Neg Hx    Hearing loss Neg Hx    Heart disease Neg Hx    Hyperlipidemia Neg Hx    Hypertension Neg Hx    Kidney disease Neg Hx    Learning disabilities Neg Hx    Mental illness Neg Hx    Mental retardation Neg Hx    Miscarriages / Stillbirths Neg Hx    Stroke Neg Hx    Vision loss Neg Hx    Varicose Veins Neg Hx     Social History Social History   Tobacco Use   Smoking status: Never   Smokeless tobacco: Never  Vaping Use   Vaping  Use: Never used  Substance Use Topics   Alcohol use: No   Drug use: No     Allergies   Patient has no known allergies.   Review of Systems Review of Systems  As per HPI  Physical Exam Triage Vital Signs ED Triage Vitals  Enc Vitals Group     BP 12/29/22 1544 105/71     Pulse Rate 12/29/22 1544 89     Resp 12/29/22 1544 16     Temp 12/29/22 1544 98.1 F (36.7 C)     Temp Source 12/29/22 1544 Oral     SpO2 12/29/22 1544 97 %     Weight --      Height --      Head Circumference --      Peak Flow --      Pain Score 12/29/22 1541 6     Pain Loc --      Pain Edu? --      Excl. in GC? --    No data found.  Updated Vital Signs BP 105/71 (BP Location: Left Arm)   Pulse 89   Temp 98.1 F (36.7 C) (Oral)   Resp 16   SpO2 97%   Breastfeeding No    Physical Exam Vitals and nursing note reviewed.  Constitutional:      General: She is not in acute distress.    Appearance: She is not ill-appearing.  HENT:     Right Ear: Ear canal normal. A middle ear effusion is present. Tympanic membrane is erythematous.     Left Ear:  Ear canal normal.     Nose: Congestion and rhinorrhea present.     Mouth/Throat:     Mouth: Mucous membranes are moist.     Pharynx: Oropharynx is clear. No posterior oropharyngeal erythema.  Eyes:     Conjunctiva/sclera: Conjunctivae normal.  Cardiovascular:     Rate and Rhythm: Normal rate and regular rhythm.     Pulses: Normal pulses.     Heart sounds: Normal heart sounds.  Pulmonary:     Effort: Pulmonary effort is normal.     Breath sounds: Normal breath sounds. No wheezing or rales.  Musculoskeletal:     Cervical back: Normal range of motion.  Lymphadenopathy:     Cervical: No cervical adenopathy.  Skin:    General: Skin is warm and dry.  Neurological:     Mental Status: She is alert and oriented to person, place, and time.     UC Treatments / Results  Labs (all labs ordered are listed, but only abnormal results are displayed) Labs Reviewed - No data to display  EKG   Radiology No results found.  Procedures Procedures (including critical care time)  Medications Ordered in UC Medications - No data to display  Initial Impression / Assessment and Plan / UC Course  I have reviewed the triage vital signs and the nursing notes.  Pertinent labs & imaging results that were available during my care of the patient were reviewed by me and considered in my medical decision making (see chart for details).  Afebrile R otitis media. Augmentin BID x 5 which will also cover for possible bacterial etiology sinusitis. Promethazine DM QID for cough. Discussed return precautions, patient agreeable  Final Clinical Impressions(s) / UC Diagnoses   Final diagnoses:  Other non-recurrent acute nonsuppurative otitis media of right ear  Acute non-recurrent sinusitis of other sinus     Discharge Instructions      Please take the antibiotic  Augmentin twice daily for the next 5 days.  Take with food to avoid upset stomach.  Make sure you finish the full course.  If you are still  having persisting symptoms after finishing this medicine, please return.  You can also take the cough syrup 4 times daily.  If this medication makes you drowsy, take only before bedtime  Vui lng dng thu?c khng sinh Augmentin hai l?n m?i ngy trong 5 ngy ti?p theo. Dng cng v?i th?c ?n ?? trnh ?au b?ng. Hy ch?c ch?n r?ng b?n hon thnh kha h?c ??y ??. N?u b?n v?n cn cc tri?u ch?ng dai d?ng sau khi dng xong thu?c ny, vui lng quay l?i.  B?n c?ng c th? u?ng xi-r ho 4 l?n m?i ngy. N?u thu?c ny lm b?n bu?n ng?, ch? dng tr??c khi ?i ng?     ED Prescriptions     Medication Sig Dispense Auth. Provider   amoxicillin-clavulanate (AUGMENTIN) 875-125 MG tablet Take 1 tablet by mouth 2 (two) times daily for 5 days. 10 tablet Starlee Corralejo, PA-C   promethazine-dextromethorphan (PROMETHAZINE-DM) 6.25-15 MG/5ML syrup Take 5 mLs by mouth 4 (four) times daily as needed for cough. 180 mL Icess Bertoni, Lurena Joiner, PA-C      PDMP not reviewed this encounter.   Anureet Bruington, Lurena Joiner, [place] 12/29/22 1714

## 2022-12-29 NOTE — ED Notes (Signed)
Reviewed work note with Nurse, learning disability and patient/family

## 2022-12-29 NOTE — ED Triage Notes (Signed)
Patient here today with c/o sinus pressure, HA, runny nose, ST, and cough X 1 week.  No sick contacts. No recent travel. She was seen last week for itchy eyes and prescribed eye drops and zyrtec. Her itchy eyes have gotten better.

## 2023-01-27 ENCOUNTER — Other Ambulatory Visit: Payer: Self-pay

## 2023-01-27 ENCOUNTER — Other Ambulatory Visit (HOSPITAL_COMMUNITY)
Admission: RE | Admit: 2023-01-27 | Discharge: 2023-01-27 | Disposition: A | Discharge status: Home / Self Care | Payer: Medicaid Other | Source: Ambulatory Visit | Attending: Family Medicine | Admitting: Family Medicine

## 2023-01-27 ENCOUNTER — Ambulatory Visit (INDEPENDENT_AMBULATORY_CARE_PROVIDER_SITE_OTHER): Payer: Medicaid Other

## 2023-01-27 VITALS — BP 108/74 | HR 69 | Ht 63.0 in | Wt 163.5 lb

## 2023-01-27 DIAGNOSIS — Z3042 Encounter for surveillance of injectable contraceptive: Secondary | ICD-10-CM | POA: Diagnosis not present

## 2023-01-27 DIAGNOSIS — N898 Other specified noninflammatory disorders of vagina: Secondary | ICD-10-CM | POA: Insufficient documentation

## 2023-01-27 MED ORDER — MEDROXYPROGESTERONE ACETATE 150 MG/ML IM SUSY
150.0000 mg | PREFILLED_SYRINGE | Freq: Once | INTRAMUSCULAR | Status: AC
  Administered 2023-01-27: 150 mg via INTRAMUSCULAR

## 2023-01-27 NOTE — Progress Notes (Signed)
Kelli Christian here for Depo-Provera Injection. Patient is 11w since last depo injection administered on 11/11/2022; patient within window for next depo injection today. Injection administered without complication. Patient will return in 3 months for next injection between 8/5 and 8/19. Next annual visit due 10/05/2023.   Patient also c/o vaginal itching present for x2 weeks; patient also reports white, thick discharge but no changes in odor. Self-swab obtained. Informed patient that results should come back in 2-3 days, and once we have results we can send in proper treatment. Patient agreed and verbalized understanding; no further questions or concerns.   Meryl Crutch, RN 01/27/2023  11:20 AM

## 2023-01-28 LAB — CERVICOVAGINAL ANCILLARY ONLY
Bacterial Vaginitis (gardnerella): NEGATIVE
Candida Glabrata: NEGATIVE
Candida Vaginitis: NEGATIVE
Chlamydia: NEGATIVE
Comment: NEGATIVE
Comment: NEGATIVE
Comment: NEGATIVE
Comment: NEGATIVE
Comment: NEGATIVE
Comment: NORMAL
Neisseria Gonorrhea: NEGATIVE
Trichomonas: NEGATIVE

## 2023-03-22 ENCOUNTER — Ambulatory Visit (HOSPITAL_COMMUNITY)
Admission: EM | Admit: 2023-03-22 | Discharge: 2023-03-22 | Disposition: A | Discharge status: Home / Self Care | Payer: Medicaid Other | Attending: Emergency Medicine | Admitting: Emergency Medicine

## 2023-03-22 ENCOUNTER — Encounter (HOSPITAL_COMMUNITY): Payer: Self-pay

## 2023-03-22 DIAGNOSIS — M5412 Radiculopathy, cervical region: Secondary | ICD-10-CM | POA: Diagnosis not present

## 2023-03-22 DIAGNOSIS — M79601 Pain in right arm: Secondary | ICD-10-CM | POA: Diagnosis not present

## 2023-03-22 MED ORDER — CYCLOBENZAPRINE HCL 10 MG PO TABS: 10.0000 mg | ORAL_TABLET | Freq: Two times a day (BID) | ORAL | 0 refills | Status: DC | PRN

## 2023-03-22 MED ORDER — IBUPROFEN 800 MG PO TABS: 800.0000 mg | ORAL_TABLET | Freq: Three times a day (TID) | ORAL | 0 refills | Status: DC

## 2023-03-22 NOTE — ED Provider Notes (Signed)
[facility]    CSN: [id number] Arrival date & time: 03/22/23  1005     History   Chief Complaint Chief Complaint  Patient presents with   Numbness    HPI Kelli Christian is a 32 y.o. female.  Medical interpretor used for encounter 3 week history of left arm numbness. Reports intermittent. Worse at night if she sleeps on her left arm. Feels occasionally during the day  Denies injury or trauma to neck, back, or arms. No history of this  No fevers. 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 Authorizing Provider  cetirizine (ZYRTEC ALLERGY) 10 MG tablet Take 1 tablet (10 mg total) by mouth daily. 12/22/22 03/22/23 Yes Rinaldo Ratel, [place], FNP  cyclobenzaprine (FLEXERIL) 10 MG tablet Take 1 tablet (10 mg total) by mouth 2 (two) times daily as needed for muscle spasms. 03/22/23  Yes Maydell Knoebel, Lurena Joiner, PA-C  ibuprofen (ADVIL) 800 MG tablet Take 1 tablet (800  mg total) by mouth 3 (three) times daily. 03/22/23  Yes Nidya Bouyer, Lurena Joiner, PA-C    Family History Family History  Problem Relation Age of Onset   Alcohol abuse Neg Hx    Arthritis Neg Hx    Asthma Neg Hx    Birth defects Neg Hx    Cancer Neg Hx    COPD Neg Hx    Depression Neg Hx    Diabetes Neg Hx    Drug abuse Neg Hx    Early death Neg Hx    Hearing loss Neg Hx    Heart disease Neg Hx    Hyperlipidemia Neg Hx    Hypertension Neg Hx    Kidney disease Neg Hx    Learning disabilities Neg Hx    Mental illness Neg Hx    Mental retardation Neg Hx    Miscarriages / Stillbirths Neg Hx    Stroke Neg Hx    Vision loss Neg Hx    Varicose Veins Neg Hx     Social History Social History   Tobacco Use   Smoking status: Never   Smokeless tobacco: Never  Vaping Use   Vaping status: Never  Used  Substance Use Topics   Alcohol use: No   Drug use: No     Allergies   Patient has no known allergies.   Review of Systems Review of Systems As per HPI  Physical Exam Triage Vital Signs ED Triage Vitals  Encounter Vitals Group     BP 03/22/23 1034 120/79     Systolic BP Percentile --      Diastolic BP Percentile --      Pulse Rate 03/22/23 1034 83     Resp 03/22/23 1034 16     Temp 03/22/23 1034 98.3 F (36.8 C)     Temp Source 03/22/23 1034 Oral     SpO2 03/22/23 1034 97 %     Weight 03/22/23 1034 160 lb (72.6 kg)     Height --      Head Circumference --      Peak Flow --      Pain Score 03/22/23 1031 6     Pain Loc --      Pain Education --      Exclude from Growth Chart --    No data found.  Updated Vital Signs BP 120/79 (BP Location: Right Arm)   Pulse 83   Temp 98.3 F (36.8 C) (Oral)   Resp 16   Wt 160 lb (72.6 kg)   LMP 03/15/2023 (Approximate)   SpO2 97%   Breastfeeding Yes   BMI 28.34 kg/m   Physical Exam Vitals and nursing note reviewed.  Constitutional:      General: She is not in acute distress. HENT:     Mouth/Throat:     Mouth: Mucous  membranes are moist.     Pharynx: Oropharynx is clear.  Eyes:     Extraocular Movements: Extraocular movements intact.     Conjunctiva/sclera: Conjunctivae normal.     Pupils: Pupils are equal, round, and reactive to light.  Neck:     Comments: No bony or muscular tenderness of neck. Full ROM Cardiovascular:     Rate and Rhythm: Normal rate and regular rhythm.     Heart sounds: Normal heart sounds.  Pulmonary:     Effort: Pulmonary effort is normal.     Breath sounds: Normal breath sounds.  Musculoskeletal:        General: Normal range of motion.     Cervical back: Normal range of motion. No rigidity or tenderness.     Comments: Full ROM of bilat upper extremities   Skin:    General: Skin is warm and dry.  Neurological:     General: No focal deficit present.     Mental Status: She is alert and oriented to person, place, and time.     Cranial Nerves: Cranial nerves 2-12 are intact. No cranial nerve deficit.     Sensory: Sensation is intact.     Motor: Motor function is intact. No weakness or pronator drift.     Coordination: Coordination is intact.     Gait: Gait is intact.     Deep Tendon Reflexes: Reflexes are normal and symmetric.     Comments: Strength RUE 5/5, LUE 4/5 Sensation intact, mildly decreased left. Strong radial pulses bilat. Cap refill < 2 seconds.      UC Treatments / Results  Labs (all labs ordered are listed, but only abnormal results are displayed) Labs Reviewed - No data to display  EKG   Radiology No results found.  Procedures Procedures (including critical care time)  Medications Ordered in UC  Medications - No data to display  Initial Impression / Assessment and Plan / UC Course  I have reviewed the triage vital signs and the nursing notes.  Pertinent labs & imaging results that were available during my care of the patient were reviewed by me and considered in my medical decision making (see chart for details).  Vitals are stable Right  upper extremity pain likely muscular from repetitive movement at work. Non tender.  Left upper extremity symptoms consistent with radiculopathy. She is neurologically intact, slight decrease in strength and sensation left. Overall no red flags. Likely will need MRI if symptoms persisting.  For now treat with ibuprofen 800 mg TID prn, flexeril BID prn and close follow up with orthopedics. Discussed reasons to be evaluated in the emergency department. Patient is agreeable to plan, all questions answered.   Final Clinical Impressions(s) / UC Diagnoses   Final diagnoses:  Cervical radiculopathy  Right arm pain     Discharge Instructions      Ibuprofen can be used 3x daily for pain and inflammation You can take the muscle relaxer Flexeril twice daily. If the medication makes you drowsy, take only at bed time. Please follow up with orthopedics. They have walk-in hours or you can call to make an appointment.  If your symptoms worsen, including loss of sensation in the arm or inability to lift it, please go to the emergency department.   C th? dng Ibuprofen 3 l?n m?i ngy ?? gi?m ?au v vim B?n c th? dng thu?c gin c? Flexeril hai l?n m?i ngy. N?u thu?c lm b?n bu?n ng?, ch? dng vo lc ?i ng?. Vui lng theo di khoa ch?nh hnh. H? c gi? lm vi?c ho?c b?n c th? g?i ?i?n ?? ??t l?ch h?n.  N?u cc tri?u ch?ng c?a b?n tr? nn tr?m tr?ng h?n, bao g?m m?t c?m gic ? cnh tay ho?c khng th? nh?c cnh tay ln, vui lng ??n phng c?p c?u.      ED Prescriptions     Medication Sig Dispense Auth. Provider   ibuprofen (ADVIL) 800 MG tablet Take 1 tablet (800 mg total) by mouth 3 (three) times daily. 21 tablet Naly Schwanz, PA-C   cyclobenzaprine (FLEXERIL) 10 MG tablet Take 1 tablet (10 mg total) by mouth 2 (two) times daily as needed for muscle spasms. 20 tablet Deavion Dobbs, Lurena Joiner, PA-C      PDMP not reviewed this encounter.   Husna Krone, Ray Church 03/22/23 1254

## 2023-03-22 NOTE — ED Triage Notes (Signed)
Patient here today with c/o left arm numbness and right arm pain X 3 weeks. She will occasionally have some neck pain.

## 2023-03-22 NOTE — Discharge Instructions (Addendum)
Ibuprofen can be used 3x daily for pain and inflammation You can take the muscle relaxer Flexeril twice daily. If the medication makes you drowsy, take only at bed time. Please follow up with orthopedics. They have walk-in hours or you can call to make an appointment.  If your symptoms worsen, including loss of sensation in the arm or inability to lift it, please go to the emergency department.   C th? dng Ibuprofen 3 l?n m?i ngy ?? gi?m ?au v vim B?n c th? dng thu?c gin c? Flexeril hai l?n m?i ngy. N?u thu?c lm b?n bu?n ng?, ch? dng vo lc ?i ng?. Vui lng theo di khoa ch?nh hnh. H? c gi? lm vi?c ho?c b?n c th? g?i ?i?n ?? ??t l?ch h?n.  N?u cc tri?u ch?ng c?a b?n tr? nn tr?m tr?ng h?n, bao g?m m?t c?m gic ? cnh tay ho?c khng th? nh?c cnh tay ln, vui lng ??n phng c?p c?u.

## 2023-04-14 ENCOUNTER — Ambulatory Visit (INDEPENDENT_AMBULATORY_CARE_PROVIDER_SITE_OTHER): Payer: Medicaid Other

## 2023-04-14 ENCOUNTER — Other Ambulatory Visit: Payer: Self-pay

## 2023-04-14 VITALS — BP 114/72 | HR 77 | Wt 171.2 lb

## 2023-04-14 DIAGNOSIS — Z3042 Encounter for surveillance of injectable contraceptive: Secondary | ICD-10-CM | POA: Diagnosis not present

## 2023-04-14 MED ORDER — MEDROXYPROGESTERONE ACETATE 150 MG/ML IM SUSY
150.0000 mg | PREFILLED_SYRINGE | Freq: Once | INTRAMUSCULAR | Status: AC
  Administered 2023-04-14: 150 mg via INTRAMUSCULAR

## 2023-04-14 NOTE — Progress Notes (Signed)
Analis Funnell here for Depo-Provera Injection. Injection administered without complication. Patient will return in 3 months for next injection between 06/30/23 and 07/14/23. Next annual visit due January 2025. Pt is unsure if she wants to continue Depo Provera because she is no longer sexually active. Plans to continue taking until 1 year postpartum when she anticipates losing her insurance coverage.  Reports some issue with milk supply. She noticed a decrease in supply when she began working and was unable to feed baby as often. She is now at home and not working and is available to breastfeed. Currently feeding some at the breast and supplementing with formula. Recommended feeding baby at breast first, then providing formula if baby is still showing hunger cues. Recommended patient pump in between feedings approx every 3 hours and to increase hydration while trying to reestablish milk supply.  [employer] interpreter [place] [phone] present virtually to interpret encounter.  Marjo Bicker, RN 04/14/2023  11:02 AM

## 2023-06-30 ENCOUNTER — Ambulatory Visit: Payer: Medicaid Other

## 2023-12-06 ENCOUNTER — Ambulatory Visit (HOSPITAL_COMMUNITY)
Admission: EM | Admit: 2023-12-06 | Discharge: 2023-12-06 | Disposition: A | Discharge status: Home / Self Care | Attending: Emergency Medicine | Admitting: Emergency Medicine

## 2023-12-06 ENCOUNTER — Encounter (HOSPITAL_COMMUNITY): Payer: Self-pay | Admitting: Emergency Medicine

## 2023-12-06 DIAGNOSIS — H1013 Acute atopic conjunctivitis, bilateral: Secondary | ICD-10-CM

## 2023-12-06 DIAGNOSIS — G8929 Other chronic pain: Secondary | ICD-10-CM

## 2023-12-06 DIAGNOSIS — M545 Low back pain, unspecified: Secondary | ICD-10-CM

## 2023-12-06 MED ORDER — OLOPATADINE HCL 0.1 % OP SOLN: 1.0000 [drp] | Freq: Two times a day (BID) | OPHTHALMIC | 12 refills | Status: DC

## 2023-12-06 MED ORDER — IBUPROFEN 600 MG PO TABS: 600.0000 mg | ORAL_TABLET | Freq: Four times a day (QID) | ORAL | 0 refills | Status: DC | PRN

## 2023-12-06 NOTE — Discharge Instructions (Addendum)
 S? d?ng Pataday hai l?n m?i ngy ?? gip gi?m ng?a m?t. ??i v?i ch?ng ?au l?ng mn tnh, b?n c th? dng ibuprofen v xen k? v?i 500 mg Tylenol. Ch??m ?m, ko gin nh? nhng v ho?t ??ng th? ch?t c?ng c th? gip ch. N?u tnh tr?ng ny v?n ti?p di?n, vui lng theo di v?i bc s? ch?nh hnh ?? ?nh gi thm. Quay l?i phng khm n?u c b?t k? tri?u ch?ng m?i ho?c c?p c?u no.  Use the Pataday twice daily to help with your itchy eyes.  For your chronic low back pain you can take ibuprofen and alternate this with 500 mg of Tylenol.  Warm compresses, gentle stretching and physical activity may help as well.  If this continues please follow-up with orthopedic for further evaluation.  Return to clinic for any new or urgent symptoms.

## 2023-12-06 NOTE — ED Triage Notes (Signed)
 Pt adds that she had back pain for a month. Denies fall or injury.

## 2023-12-06 NOTE — ED Provider Notes (Signed)
 [facility]    CSN: [id number] Arrival date & time: 12/06/23  1740      History   Chief Complaint Chief Complaint  Patient presents with   Eye Problem   Back Pain    HPI Kelli Christian is a 33 y.o. female.   Medical interpretor used for this encounter.  Husband and children present for encounter.  Reports bilateral itchy eyes for the past few days. Allergies to tree pollen. Has used OTC eye drops for red eyes. Watery drainage. No vision changes.   Back pain for the past month. Has not tried any medications. Has trouble turning in bed, bending and twisting. Feels like she cannot stand for long periods of time. Denies any trauma, falls or MVCs. Pain is where she got her epidural, has had an epidural with each of her 4 children.       The history is provided by the patient and medical records. The history is limited by a language barrier. A language interpreter was used.  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 Authorizing Provider  ibuprofen (ADVIL) 600 MG tablet Take 1 tablet (600 mg total) by mouth every 6 (six)  hours as needed. 12/06/23  Yes Rinaldo Ratel, [place] N, FNP  olopatadine (PATADAY) 0.1 % ophthalmic solution Place 1 drop into both eyes 2 (two) times daily. 12/06/23  Yes Shaqueta Casady, [place] N, FNP    Family History Family History  Problem Relation Age of Onset   Alcohol abuse Neg Hx    Arthritis Neg Hx    Asthma Neg Hx    Birth defects Neg Hx    Cancer Neg Hx    COPD Neg Hx    Depression Neg Hx    Diabetes Neg Hx    Drug abuse Neg Hx    Early death Neg Hx    Hearing loss Neg Hx    Heart disease Neg Hx    Hyperlipidemia Neg Hx    Hypertension Neg Hx    Kidney disease Neg Hx    Learning disabilities Neg Hx    Mental illness Neg Hx    Mental retardation Neg Hx    Miscarriages / Stillbirths Neg Hx    Stroke Neg Hx    Vision loss Neg Hx    Varicose Veins Neg Hx  Social History Social History   Tobacco Use   Smoking status: Never   Smokeless tobacco: Never  Vaping Use   Vaping status: Never Used  Substance Use Topics   Alcohol use: No   Drug use: No     Allergies   Patient has no known allergies.   Review of Systems Review of Systems  Per HPI  Physical Exam Triage Vital Signs ED Triage Vitals  Encounter Vitals Group     BP 12/06/23 1827 (!) 136/90     Systolic BP Percentile --      Diastolic BP Percentile --      Pulse Rate 12/06/23 1827 77     Resp 12/06/23 1827 15     Temp 12/06/23 1827 98.1 F (36.7 C)     Temp Source 12/06/23 1827 Oral     SpO2 12/06/23 1827 97 %     Weight --      Height --      Head Circumference --      Peak Flow --      Pain Score 12/06/23 1824 0     Pain Loc --      Pain Education --      Exclude from Growth Chart --    No data found.  Updated Vital Signs BP (!) 136/90 (BP Location: Right Arm)   Pulse 77   Temp 98.1 F (36.7 C) (Oral)   Resp 15   SpO2 97%   Breastfeeding Yes   Visual Acuity Right Eye Distance:   Left Eye Distance:   Bilateral Distance:    Right Eye Near:   Left Eye Near:    Bilateral  Near:     Physical Exam Vitals and nursing note reviewed.  Constitutional:      Appearance: Normal appearance.  HENT:     Head: Normocephalic and atraumatic.     Right Ear: External ear normal.     Left Ear: External ear normal.     Nose: Nose normal.     Mouth/Throat:     Mouth: Mucous membranes are moist.  Eyes:     General:        Right eye: Discharge present.        Left eye: Discharge present.    Pupils: Pupils are equal, round, and reactive to light.  Cardiovascular:     Rate and Rhythm: Normal rate.  Pulmonary:     Effort: Pulmonary effort is normal. No respiratory distress.  Musculoskeletal:        General: No swelling, tenderness, deformity or signs of injury. Normal range of motion.  Skin:    General: Skin is warm and dry.  Neurological:     General: No focal deficit present.     Mental Status: She is alert.  Psychiatric:        Mood and Affect: Mood normal.        Behavior: Behavior is cooperative.      UC Treatments / Results  Labs (all labs ordered are listed, but only abnormal results are displayed) Labs Reviewed - No data to display  EKG   Radiology No results found.  Procedures Procedures (including critical care time)  Medications Ordered in UC Medications - No data to display  Initial Impression / Assessment and Plan / UC Course  I have reviewed the triage vital signs and the nursing notes.  Pertinent labs & imaging results that were available during my care of the patient were reviewed by me and considered  in my medical decision making (see chart for details).  Vitals and triage reviewed, patient is hemodynamically stable.  Bilateral eyes with conjunctival injection, watery drainage.  PERRLA.  Symptoms consistent with allergic conjunctivitis, Pataday prescribed.  Low back pain is chronic and ongoing, has had epidurals with each of her children.  Atraumatic and no changes.  Will trial anti-inflammatories and encouraged orthopedic follow-up  for chronic low back pain.  Plan of care, follow-up care return precautions given, no questions at this time.     Final Clinical Impressions(s) / UC Diagnoses   Final diagnoses:  Allergic conjunctivitis of both eyes  Chronic midline low back pain without sciatica     Discharge Instructions      S? d?ng Pataday hai l?n m?i ngy ?? gip gi?m ng?a m?t. ??i v?i ch?ng ?au l?ng mn tnh, b?n c th? dng ibuprofen v xen k? v?i 500 mg Tylenol. Ch??m ?m, ko gin nh? nhng v ho?t ??ng th? ch?t c?ng c th? gip ch. N?u tnh tr?ng ny v?n ti?p di?n, vui lng theo di v?i bc s? ch?nh hnh ?? ?nh gi thm. Quay l?i phng khm n?u c b?t k? tri?u ch?ng m?i ho?c c?p c?u no.  Use the Pataday twice daily to help with your itchy eyes.  For your chronic low back pain you can take ibuprofen and alternate this with 500 mg of Tylenol.  Warm compresses, gentle stretching and physical activity may help as well.  If this continues please follow-up with orthopedic for further evaluation.  Return to clinic for any new or urgent symptoms.     ED Prescriptions     Medication Sig Dispense Auth. Provider   olopatadine (PATADAY) 0.1 % ophthalmic solution Place 1 drop into both eyes 2 (two) times daily. 5 mL Rinaldo Ratel, [place] N, [place]   ibuprofen (ADVIL) 600 MG tablet Take 1 tablet (600 mg total) by mouth every 6 (six) hours as needed. 30 tablet Luman Holway, [place] N, [place]      PDMP not reviewed this encounter.   Sinthia Karabin, [place] N, [place] 12/06/23 1900

## 2023-12-06 NOTE — ED Triage Notes (Signed)
 Used interpretor  Both eyes are itching since yesterday. Pt has allergies to tree pollen. Used eye drop with no relief.

## 2023-12-12 ENCOUNTER — Other Ambulatory Visit: Payer: Self-pay

## 2023-12-12 ENCOUNTER — Ambulatory Visit (HOSPITAL_COMMUNITY)
Admission: EM | Admit: 2023-12-12 | Discharge: 2023-12-12 | Disposition: A | Discharge status: Home / Self Care | Attending: Emergency Medicine | Admitting: Emergency Medicine

## 2023-12-12 ENCOUNTER — Encounter (HOSPITAL_COMMUNITY): Payer: Self-pay | Admitting: *Deleted

## 2023-12-12 DIAGNOSIS — H5789 Other specified disorders of eye and adnexa: Secondary | ICD-10-CM

## 2023-12-12 MED ORDER — MOXIFLOXACIN HCL 0.5 % OP SOLN: 1.0000 [drp] | Freq: Three times a day (TID) | OPHTHALMIC | 0 refills | Status: DC

## 2023-12-12 NOTE — ED Provider Notes (Signed)
 [facility]    CSN: [id number] Arrival date & time: 12/12/23  1646     History   Chief Complaint Chief Complaint  Patient presents with   Eye Problem    HPI Kelli Christian is a 33 y.o. female.  Medical interpretor used for encounter  Here with 1 week history of bilateral eye irritation, redness, itching. Not having any discharge.  Seen last week for same, had been reporting eye redness and watery discharge.  Was put on olopatadine drops.  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 Authorizing Provider  moxifloxacin (VIGAMOX) 0.5 % ophthalmic solution Place 1 drop into both eyes 3 (three) times daily. 12/12/23  Yes Beverely Suen, Lurena Joiner, PA-C  ibuprofen (ADVIL) 600 MG tablet Take 1 tablet (600 mg total) by mouth every 6 (six) hours as needed. 12/06/23   Garrison, [place] N, FNP    Family History Family History  Problem Relation Age of Onset   Alcohol abuse Neg Hx    Arthritis Neg Hx     Asthma Neg Hx    Birth defects Neg Hx    Cancer Neg Hx    COPD Neg Hx    Depression Neg Hx    Diabetes Neg Hx    Drug abuse Neg Hx    Early death Neg Hx    Hearing loss Neg Hx    Heart disease Neg Hx    Hyperlipidemia Neg Hx    Hypertension Neg Hx    Kidney disease Neg Hx    Learning disabilities Neg Hx    Mental illness Neg Hx    Mental retardation Neg Hx    Miscarriages / Stillbirths Neg Hx    Stroke Neg Hx    Vision loss Neg Hx    Varicose Veins Neg Hx     Social History Social History   Tobacco Use   Smoking status: Never   Smokeless tobacco: Never  Vaping Use   Vaping status: Never Used  Substance Use Topics   Alcohol use: No   Drug use: No     Allergies   Patient has no known allergies.   Review of Systems Review of Systems  Per HPI  Physical Exam Triage Vital Signs ED Triage Vitals  Encounter Vitals Group     BP 12/12/23 1718 128/88     Systolic BP Percentile --      Diastolic BP Percentile --      Pulse Rate 12/12/23 1718 73     Resp 12/12/23 1718 18     Temp 12/12/23 1718 97.6 F (36.4 C)     Temp src --      SpO2 12/12/23 1718 97 %     Weight --      Height --      Head Circumference --      Peak Flow --      Pain Score 12/12/23 1715 0     Pain Loc --      Pain Education --      Exclude from Growth Chart --    No data found.  Updated Vital Signs BP 128/88   Pulse 73   Temp 97.6 F (36.4 C)   Resp 18   SpO2 97%   Breastfeeding Yes   Visual Acuity Right Eye Distance:   Left Eye Distance:   Bilateral Distance:    Right Eye Near:   Left Eye Near:    Bilateral Near:     Physical Exam Vitals and nursing note reviewed.  Constitutional:      General: She is not in acute distress. HENT:     Right Ear: Tympanic membrane and ear canal normal.     Left Ear: Tympanic membrane and ear canal normal.     Nose: No congestion or rhinorrhea.     Mouth/Throat:     Mouth: Mucous membranes are moist.     Pharynx: Oropharynx is  clear. No posterior oropharyngeal erythema.  Eyes:     General: Lids are normal. Vision grossly intact. Gaze aligned appropriately.        Right eye: No foreign body or discharge.        Left eye: No foreign body or discharge.     Extraocular Movements: Extraocular movements intact.     Right eye: Normal extraocular motion.     Left eye: Normal extraocular motion.     Conjunctiva/sclera:     Right eye: Right conjunctiva is injected.     Left eye: Left conjunctiva is injected.     Pupils: Pupils are equal, round, and reactive to light.     Comments: Bilateral conjunctiva are mildly injected.  There is no discharge, foreign body, eyelid swelling or erythema.  EOM intact without pain  Cardiovascular:     Rate and Rhythm: Normal rate and regular rhythm.     Heart sounds: Normal heart sounds.  Pulmonary:     Effort: Pulmonary effort is normal.     Breath sounds: Normal breath sounds.  Musculoskeletal:     Cervical back: Normal range of motion.  Skin:    General: Skin is warm and dry.  Neurological:     Mental Status: She is alert and oriented to person, place, and time.      UC Treatments / Results  Labs (all labs ordered are listed, but only abnormal results are displayed) Labs Reviewed - No data to display  EKG  Radiology No results found.  Procedures Procedures   Medications Ordered in UC Medications - No data to display  Initial Impression / Assessment and Plan / UC Course  I have reviewed the triage vital signs and the nursing notes.  Pertinent labs & imaging  results that were available during my care of the patient were reviewed by me and considered in my medical decision making (see chart for details).  Bilateral eye irritation for over a week.  Has been using olopatadine drops several times daily.  She does not have overt symptoms of bacterial eye infection, however with duration we will go ahead and try Vigamox 3 times daily.  Advised contacting eye doctor for  follow-up.  Patient is agreeable to plan, no questions  Final Clinical Impressions(s) / UC Diagnoses   Final diagnoses:  Eye irritation     Discharge Instructions      Vigamox -- 1 drop in each eye, 3 times daily, for 7 days  Please call eye doctor for follow up appointment      ED Prescriptions     Medication Sig Dispense Auth. Provider   moxifloxacin (VIGAMOX) 0.5 % ophthalmic solution Place 1 drop into both eyes 3 (three) times daily. 3 mL Dorrell Mitcheltree, Lurena Joiner, PA-C      PDMP not reviewed this encounter.   Marlow Baars, [place] 12/12/23 1610

## 2023-12-12 NOTE — ED Triage Notes (Signed)
 PT sen last week for eye irritation . Pt has been using eye drops and the irritation has not improved.

## 2023-12-12 NOTE — Discharge Instructions (Addendum)
 Vigamox -- 1 drop in each eye, 3 times daily, for 7 days  Please call eye doctor for follow up appointment

## 2024-04-19 ENCOUNTER — Encounter (HOSPITAL_COMMUNITY): Payer: Self-pay | Admitting: Emergency Medicine

## 2024-04-19 ENCOUNTER — Ambulatory Visit (HOSPITAL_COMMUNITY)
Admission: EM | Admit: 2024-04-19 | Discharge: 2024-04-19 | Disposition: A | Discharge status: Home / Self Care | Attending: Emergency Medicine | Admitting: Emergency Medicine

## 2024-04-19 DIAGNOSIS — J988 Other specified respiratory disorders: Secondary | ICD-10-CM | POA: Diagnosis not present

## 2024-04-19 DIAGNOSIS — B9789 Other viral agents as the cause of diseases classified elsewhere: Secondary | ICD-10-CM

## 2024-04-19 DIAGNOSIS — R051 Acute cough: Secondary | ICD-10-CM | POA: Diagnosis not present

## 2024-04-19 LAB — POC SARS CORONAVIRUS 2 AG -  ED: SARS Coronavirus 2 Ag: NEGATIVE

## 2024-04-19 MED ORDER — PROMETHAZINE-DM 6.25-15 MG/5ML PO SYRP: 5.0000 mL | ORAL_SOLUTION | Freq: Every evening | ORAL | 0 refills | Status: DC | PRN

## 2024-04-19 MED ORDER — BENZONATATE 100 MG PO CAPS: 100.0000 mg | ORAL_CAPSULE | Freq: Three times a day (TID) | ORAL | 0 refills | Status: DC

## 2024-04-19 MED ORDER — AZELASTINE HCL 0.1 % NA SOLN: 2.0000 | Freq: Two times a day (BID) | NASAL | 0 refills | Status: DC

## 2024-04-19 NOTE — ED Provider Notes (Signed)
 [facility]    CSN: [id number] Arrival date & time: 04/19/24  1614      History   Chief Complaint Chief Complaint  Patient presents with   Nasal Congestion   Cough   Headache    HPI Kelli Christian is a 33 y.o. female.   Patient presents with nasal congestion, rhinorrhea, productive cough, and intermittent headache that began yesterday.  Patient denies taking any medication for her symptoms.  Patient states that her mother recently had similar symptoms as well.  Patient states that she has been rubbing some oil on her forehead without relief of her headache.  Denies fever, body aches, chills, weakness, chest pain, shortness of breath, nausea, vomiting, diarrhea, and abdominal pain.  Of note patient states that she is no longer breast-feeding.  The history is provided by the patient and medical records. The history is limited by a language barrier. A language interpreter was used [place] interpreter).  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 Authorizing  Provider  azelastine  (ASTELIN ) 0.1 % nasal spray Place 2 sprays into both nostrils 2 (two) times daily. Use in each nostril as directed 04/19/24  Yes Johnie Flaming A, NP  benzonatate  (TESSALON ) 100 MG capsule Take 1 capsule (100 mg total) by mouth every 8 (eight) hours. 04/19/24  Yes Johnie Flaming A, NP  promethazine -dextromethorphan (PROMETHAZINE -DM) 6.25-15 MG/5ML syrup Take 5 mLs by mouth at bedtime as needed for cough. 04/19/24  Yes Johnie, Santia Labate A, NP  ibuprofen  (ADVIL ) 600 MG tablet Take 1 tablet (600 mg total) by mouth every 6 (six) hours as needed. 12/06/23   Dreama, [place]  N, FNP    Family History Family History  Problem Relation Age of Onset   Alcohol abuse Neg Hx    Arthritis Neg Hx    Asthma Neg Hx    Birth defects Neg Hx    Cancer Neg Hx    COPD Neg Hx    Depression Neg Hx    Diabetes Neg Hx    Drug abuse Neg Hx    Early death Neg Hx    Hearing loss Neg Hx    Heart disease Neg Hx    Hyperlipidemia Neg Hx    Hypertension Neg Hx    Kidney disease Neg  Hx    Learning disabilities Neg Hx    Mental illness Neg Hx    Mental retardation Neg Hx    Miscarriages / Stillbirths Neg Hx    Stroke Neg Hx    Vision loss Neg Hx    Varicose Veins Neg Hx     Social History Social History   Tobacco Use   Smoking status: Never   Smokeless tobacco: Never  Vaping Use   Vaping status: Never Used  Substance Use Topics   Alcohol use: No   Drug use: No     Allergies   Patient has no known allergies.   Review of Systems Review of Systems  Respiratory:  Positive for cough.   Neurological:  Positive for headaches.     Physical Exam Triage Vital Signs ED Triage Vitals  Encounter Vitals Group     BP 04/19/24 1758 122/89     Girls Systolic BP Percentile --      Girls Diastolic BP Percentile --      Boys Systolic BP Percentile --      Boys Diastolic BP Percentile --      Pulse Rate 04/19/24 1758 73     Resp 04/19/24 1758 17     Temp 04/19/24 1758 98.2 F (36.8  C)     Temp Source 04/19/24 1758 Oral     SpO2 04/19/24 1758 98 %     Weight --      Height --      Head Circumference --      Peak Flow --      Pain Score 04/19/24 1755 3     Pain Loc --      Pain Education --      Exclude from Growth Chart --    No data found.  Updated Vital Signs BP 122/89 (BP Location: Right Arm)   Pulse 73   Temp 98.2 F (36.8 C) (Oral)   Resp 17   LMP 03/28/2024 (Approximate)   SpO2 98%   Visual Acuity Right Eye Distance:   Left Eye Distance:   Bilateral Distance:    Right Eye Near:   Left Eye Near:    Bilateral Near:     Physical Exam Vitals and nursing note reviewed.  Constitutional:      General: She is awake. She is not in acute distress.    Appearance: Normal appearance. She is well-developed and well-groomed. She is not ill-appearing.  HENT:     Right Ear: Tympanic membrane, ear canal and external ear normal.     Left Ear: Tympanic membrane, ear canal and external ear normal.     Nose: Congestion and rhinorrhea present.     Mouth/Throat:     Mouth: Mucous membranes are moist.     Pharynx: Posterior oropharyngeal erythema and postnasal drip present. No oropharyngeal exudate.  Cardiovascular:     Rate and Rhythm: Normal rate and regular rhythm.  Pulmonary:     Effort: Pulmonary effort is normal.     Breath sounds: Normal breath sounds.  Skin:    General: Skin is warm and dry.  Neurological:     Mental Status: She is alert.  Psychiatric:        Behavior: Behavior is cooperative.      UC Treatments / Results  Labs (all labs ordered are listed, but only abnormal results are displayed) Labs Reviewed  POC SARS CORONAVIRUS 2 AG -  ED    EKG   Radiology No results found.  Procedures Procedures (including critical care time)  Medications Ordered in UC Medications - No data to display  Initial Impression / Assessment and Plan / UC Course  I have reviewed the triage vital signs and the nursing notes.  Pertinent labs &  imaging results that were available during my care of the patient were reviewed by me and considered in my medical decision making (see chart for details).     Patient is overall well-appearing.  Vitals are stable.  Mild congestion and rhinorrhea are present, mild erythema and PND noted to pharynx.  Lungs clear bilaterally to auscultation.  COVID testing negative.  Symptoms likely viral in nature.  Prescribed Tessalon  and promethazine  DM as needed for cough.  Prescribed azelastine  for congestion.  Discussed over-the-counter medications as needed for symptoms.  Discussed follow-up and return precautions. Final Clinical Impressions(s) / UC Diagnoses   Final diagnoses:  Acute cough  Viral respiratory illness     Discharge Instructions      As discussed I believe your symptoms are likely related to a viral respiratory illness. Use azelastine  spray twice daily for congestion. Take Tessalon  every 8 hours as needed for cough. You can also take Promethazine  DM cough syrup at bedtime as needed for cough.  This can make you drowsy so do not drive, work, or drink alcohol while taking this. Otherwise you can take over-the-counter Mucinex for cough and congestion. Alternate between 650 mg of Tylenol  and 400 mg of ibuprofen  every  to 6-8 hours as needed for sore throat, headache, or any fever. Make sure you are staying hydrated and getting plenty of rest. Follow-up with your primary care provider or return here as needed.  Nh? ? th?o lu?n, ti tin r?ng cc tri?u ch?ng c?a b?n c th? lin quan ??n b?nh ???ng h h?p do virus. X?t azelastine  hai l?n m?i ngy ?? gi?m ngh?t m?i. U?ng Tessalon  m?i 8 gi? khi c?n thi?t ?? gi?m ho. B?n c?ng c th? u?ng siro ho Promethazine  DM tr??c khi ?i ng? khi c?n thi?t ?? gi?m ho. Siro ny c th? gy bu?n ng?, v v?y ??ng li xe, lm vi?c ho?c u?ng r??u khi ?ang dng siro ny. N?u khng, b?n c th? dng Mucinex khng k ??n ?? gi?m ho v ngh?t m?i. U?ng xen k? 650 mg  Tylenol  v 400 mg ibuprofen  m?i 6-8 gi? khi c?n thi?t ?? gi?m ?au h?ng, ?au ??u ho?c s?t. ??m b?o b?n u?ng ?? n??c v ngh? ng?i nhi?u. Hy theo di v?i bc s? ch?m [place] s?c kh?e ban ??u c?a b?n ho?c quay l?i ?y n?u c?n.   ED Prescriptions     Medication Sig Dispense Auth. Provider   benzonatate  (TESSALON ) 100 MG capsule Take 1 capsule (100 mg total) by mouth every 8 (eight) hours. 21 capsule Johnie Flaming A, NP   promethazine -dextromethorphan (PROMETHAZINE -DM) 6.25-15 MG/5ML syrup Take 5 mLs by mouth at bedtime as needed for cough. 118 mL Johnie, Kwaku Mostafa A, NP   azelastine  (ASTELIN ) 0.1 % nasal spray Place 2 sprays into both nostrils 2 (two) times daily. Use in each nostril as directed 30 mL Johnie Flaming A, NP      PDMP not reviewed this encounter.   Johnie Flaming A, NP 04/19/24 [phone]

## 2024-04-19 NOTE — Discharge Instructions (Signed)
 As discussed I believe your symptoms are likely related to a viral respiratory illness. Use azelastine  spray twice daily for congestion. Take Tessalon  every 8 hours as needed for cough. You can also take Promethazine  DM cough syrup at bedtime as needed for cough.  This can make you drowsy so do not drive, work, or drink alcohol while taking this. Otherwise you can take over-the-counter Mucinex for cough and congestion. Alternate between 650 mg of Tylenol  and 400 mg of ibuprofen  every  to 6-8 hours as needed for sore throat, headache, or any fever. Make sure you are staying hydrated and getting plenty of rest. Follow-up with your primary care provider or return here as needed.  Nh? ? th?o lu?n, ti tin r?ng cc tri?u ch?ng c?a b?n c th? lin quan ??n b?nh ???ng h h?p do virus. X?t azelastine  hai l?n m?i ngy ?? gi?m ngh?t m?i. U?ng Tessalon  m?i 8 gi? khi c?n thi?t ?? gi?m ho. B?n c?ng c th? u?ng siro ho Promethazine  DM tr??c khi ?i ng? khi c?n thi?t ?? gi?m ho. Siro ny c th? gy bu?n ng?, v v?y ??ng li xe, lm vi?c ho?c u?ng r??u khi ?ang dng siro ny. N?u khng, b?n c th? dng Mucinex khng k ??n ?? gi?m ho v ngh?t m?i. U?ng xen k? 650 mg Tylenol  v 400 mg ibuprofen  m?i 6-8 gi? khi c?n thi?t ?? gi?m ?au h?ng, ?au ??u ho?c s?t. ??m b?o b?n u?ng ?? n??c v ngh? ng?i nhi?u. Hy theo di v?i bc s? ch?m [place] s?c kh?e ban ??u c?a b?n ho?c quay l?i ?y n?u c?n.

## 2024-04-19 NOTE — ED Triage Notes (Signed)
 Used interpretor  Since yesterday having runny nose and nasal congestion. Hasn't taken anything for symptoms. Had cough last night with phlegm.  Adds that has had intermittent headache that started today. Hasnt taken anything for the pain. Pt reports she put rubbing oil on my forehead that didn't help.

## 2024-09-13 ENCOUNTER — Encounter (HOSPITAL_COMMUNITY): Payer: Self-pay

## 2024-09-13 ENCOUNTER — Ambulatory Visit (HOSPITAL_COMMUNITY): Admission: EM | Admit: 2024-09-13 | Discharge: 2024-09-13 | Disposition: A | Discharge status: Home / Self Care

## 2024-09-13 DIAGNOSIS — R509 Fever, unspecified: Secondary | ICD-10-CM

## 2024-09-13 DIAGNOSIS — J101 Influenza due to other identified influenza virus with other respiratory manifestations: Secondary | ICD-10-CM | POA: Diagnosis not present

## 2024-09-13 LAB — POC SOFIA SARS ANTIGEN FIA: SARS Coronavirus 2 Ag: NEGATIVE

## 2024-09-13 LAB — POCT INFLUENZA A/B
Influenza A, POC: POSITIVE — AB
Influenza B, POC: NEGATIVE

## 2024-09-13 MED ORDER — ACETAMINOPHEN 325 MG PO TABS
ORAL_TABLET | ORAL | Status: AC
  Filled 2024-09-13: qty 2

## 2024-09-13 MED ORDER — ONDANSETRON 4 MG PO TBDP: 4.0000 mg | ORAL_TABLET | Freq: Three times a day (TID) | ORAL | 0 refills | Status: AC | PRN

## 2024-09-13 MED ORDER — ONDANSETRON 4 MG PO TBDP
ORAL_TABLET | ORAL | Status: AC
  Filled 2024-09-13: qty 1

## 2024-09-13 MED ORDER — ACETAMINOPHEN 325 MG PO TABS
650.0000 mg | ORAL_TABLET | Freq: Once | ORAL | Status: AC
  Administered 2024-09-13: 650 mg via ORAL

## 2024-09-13 MED ORDER — OSELTAMIVIR PHOSPHATE 75 MG PO CAPS: 75.0000 mg | ORAL_CAPSULE | Freq: Two times a day (BID) | ORAL | 0 refills | Status: AC

## 2024-09-13 MED ORDER — ONDANSETRON 4 MG PO TBDP
4.0000 mg | ORAL_TABLET | Freq: Once | ORAL | Status: AC
  Administered 2024-09-13: 4 mg via ORAL

## 2024-09-13 NOTE — Discharge Instructions (Addendum)
 B?n d??ng tnh v?i cm A.  B?n ? ???c k ??n thu?c Tamiflu . U?ng 1 vin hai l?n m?t ngy trong 5 ngy.  B?n ? ???c k ??n thu?c ondansetron . U?ng 1 vin m?i 8 gi? khi c?n thi?t ?? gi?m bu?n nn, nn m?a.  Cm l m?t b?nh do virus gy ra, s? thuyn gi?m khi ngh? ng?i, u?ng nhi?u n??c v dng thu?c ?? gi?m cc tri?u ch?ng.  Thu?c Tylenol  v/ho?c ibuprofen  c th? gip gi?m s?t, ?au nh?c c? th?. Thu?c guaifenesin (Mucinex thng th??ng) v thu?c x?t m?i n??c mu?i sinh l c th? gip gi?m cc tri?u ch?ng.  Hai tha m?t ong v m?t c?c n??c ?m m?i 4-6 gi? c th? gip gi?m ?au h?ng.  [place] mi?ng b?ng n??c mu?i, ha tan 1/2-1 tha mu?i vo 1 c?c n??c ?m, [place] mi?ng v nh? ra nhi?u l?n m?i ngy ?? gi?m ?au h?ng.  My t?o ?? ?m trong phng vo ban ?m c th? gip lm d?u c?n ho (v? sinh my k? l??ng hng ngy).  N?u b? ?au ng?c, kh th?, khng th? gi? th?c ?n ho?c ch?t l?ng trong ng??i m khng b? nn, s?t khng gi?m khi dng Tylenol  ho?c ibuprofen , ho?c b?t k? tri?u ch?ng nghim tr?ng no khc, vui lng ??n phng c?p c?u ?? ???c ki?m tra thm. Quay l?i phng khm kh?n c?p n?u c?n thi?t, n?u khng th ti khm v?i bc s? gia ?nh.   You are positive for influenza A.  You have been given a prescription for Tamiflu .  Take 1 capsule twice a day for 5 days.  You have been given a prescription for ondansetron .  Take 1 tablet every 8 hours as needed for nausea, vomiting.  Influenza is a viral illness which will improve with rest, fluids, and medications to help with your symptoms.  Tylenol  and/or ibuprofen  for fever, body aches, pain. Guaifenesin (plain mucinex) and saline nasal sprays may help relieve symptoms.  2 teaspoons of honey and 1 cup of warm water every 4-6 hours may help with throat pains.  Salt water gargles, 1/2-1 teaspoon salt dissolved in 1 cup warm water, gargle and spit out several times daily for sore throat.  Humidifier in room at nighttime may help sooth cough (clean well  daily).  For chest pain, shortness of breath, inability to keep food or fluids down without vomiting, fever that does not respond to Tylenol  or ibuprofen , or any other severe symptoms, please go to the ER for further evaluation.  Return to urgent care as needed, otherwise follow-up with PCP.

## 2024-09-13 NOTE — ED Provider Notes (Signed)
 " [facility]    CSN: [id number] Arrival date & time: 09/13/24  1237      History   Chief Complaint Chief Complaint  Patient presents with   Cough    HPI Kelli Christian is a 34 y.o. female.   This 34 year old female is being seen for complaints of fever, headache, nasal congestion, rhinorrhea, cough, nausea and vomiting onset Saturday.  She has had 3 episodes of emesis today.  She endorses dizziness and weakness due to feeling so poorly and lack of appetite.  She reports diarrhea onset today.  She reports her children have been sick with similar symptoms.  She says they were seen in the emergency department and diagnosed with respiratory infection.  She also reports chronic bilateral shoulder for at least 1 month.  She says she has been seen in the emergency department for these complaints previously.  She denies ear pain, sore throat.  She denies chest pain, shortness of breath, abdominal pain.  She denies urinary symptoms including dysuria, urgency, or frequency.  She has not taken anything for her symptoms.  The history is provided by the patient. The history is limited by a language barrier. A language interpreter was used [phone]).  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 Authorizing Provider  ondansetron  (ZOFRAN -ODT) 4 MG disintegrating tablet Take 1 tablet (4 mg total) by mouth every 8 (eight) hours as needed for nausea or vomiting. 09/13/24  Yes Veretta Sabourin C, FNP  oseltamivir  (TAMIFLU ) 75 MG capsule Take 1 capsule (75 mg total) by mouth every 12 (twelve) hours. 09/13/24  Yes Lennice Jon BROCKS, FNP    Family History Family History  Problem Relation Age of Onset   Alcohol abuse Neg Hx    Arthritis Neg Hx    Asthma Neg Hx    Birth defects Neg Hx    Cancer Neg Hx    COPD Neg Hx    Depression Neg Hx    Diabetes Neg Hx    Drug abuse Neg Hx    Early death Neg Hx    Hearing loss Neg Hx    Heart disease Neg Hx  Hyperlipidemia Neg Hx    Hypertension Neg Hx    Kidney disease Neg Hx    Learning disabilities Neg Hx    Mental illness Neg Hx    Mental retardation Neg Hx    Miscarriages / Stillbirths Neg Hx    Stroke Neg Hx    Vision loss Neg Hx    Varicose Veins Neg Hx     Social History Social History[1]   Allergies   Patient has no known allergies.   Review of Systems Review of Systems  Constitutional:  Positive for activity change, appetite change, chills, fatigue and fever.  HENT:  Positive for congestion and rhinorrhea. Negative for ear pain and sore throat.   Respiratory:  Positive for cough. Negative for shortness of breath.   Cardiovascular:  Negative for chest pain.  Gastrointestinal:  Positive for diarrhea, nausea and vomiting. Negative for abdominal pain.  Genitourinary:  Negative for difficulty urinating, dysuria, frequency and urgency.  Skin:  Negative for color change and rash.  Neurological:  Positive for dizziness, weakness and  headaches.  All other systems reviewed and are negative.    Physical Exam Triage Vital Signs ED Triage Vitals  Encounter Vitals Group     BP 09/13/24 1515 (!) 122/90     Girls Systolic BP Percentile --      Girls Diastolic BP Percentile --      Boys Systolic BP Percentile --      Boys Diastolic BP Percentile --      Pulse Rate 09/13/24 1515 (!) 120     Resp 09/13/24 1515 16     Temp 09/13/24 1515 99.5 F (37.5 C)     Temp Source 09/13/24 1515 Oral     SpO2 09/13/24 1515 96 %     Weight --      Height --      Head Circumference --      Peak Flow --      Pain Score 09/13/24 1512 8     Pain Loc --      Pain Education --      Exclude from Growth Chart --    No data found.  Updated Vital Signs BP (!) 122/90 (BP Location: Right Arm)   Pulse (!) 120   Temp 99.5 F (37.5 C) (Oral)   Resp 16   LMP 08/30/2024 (Exact Date)   SpO2 96%   Visual Acuity Right Eye Distance:   Left Eye Distance:   Bilateral Distance:    Right Eye Near:   Left Eye Near:    Bilateral Near:     Physical Exam Vitals and nursing note reviewed.  Constitutional:      General: She is not in acute distress.    Appearance: She is well-developed. She is not toxic-appearing.     Comments: Pleasant female appearing stated age found sitting in chair in no acute distress.  HENT:     Head: Normocephalic and atraumatic.     Right Ear: Tympanic membrane and external ear normal.     Left Ear: Tympanic membrane and external ear normal.     Nose: Congestion and rhinorrhea present.     Right Turbinates: Enlarged.     Left Turbinates: Enlarged.     Mouth/Throat:     Lips: Pink.     Mouth: Mucous membranes are moist.     Pharynx: No oropharyngeal exudate or posterior oropharyngeal erythema.  Eyes:     Conjunctiva/sclera: Conjunctivae normal.  Cardiovascular:     Rate  and Rhythm: Regular rhythm. Tachycardia present.     Heart sounds: Normal heart sounds. No murmur heard. Pulmonary:     Effort: Pulmonary  effort is normal. No respiratory distress.     Breath sounds: Normal breath sounds.  Abdominal:     General: Bowel sounds are normal.     Palpations: Abdomen is soft.     Tenderness: There is no abdominal tenderness.  Musculoskeletal:     Right shoulder: Tenderness present. Normal range of motion. Normal strength. Normal pulse.     Left shoulder: Tenderness present. Normal range of motion. Normal strength. Normal pulse.     Right wrist: Normal range of motion. Normal pulse.     Cervical back: Normal and neck supple.     Thoracic back: Normal.     Lumbar back: Tenderness present. Normal range of motion.     Comments: Chronic bilateral shoulder, right wrist, back pain  Lymphadenopathy:     Cervical: Cervical adenopathy present.  Skin:    General: Skin is warm and dry.     Capillary Refill: Capillary refill takes less than 2 seconds.  Neurological:     Mental Status: She is alert.     Motor: No weakness.  Psychiatric:        Mood and Affect: Mood normal.      UC Treatments / Results  Labs (all labs ordered are listed, but only abnormal results are displayed) Labs Reviewed  POCT INFLUENZA A/B - Abnormal; Notable for the following components:      Result Value   Influenza A, POC Positive (*)    All other components within normal limits  POC SOFIA SARS ANTIGEN FIA    EKG   Radiology No results found.  Procedures Procedures (including critical care time)  Medications Ordered in UC Medications  acetaminophen  (TYLENOL ) tablet 650 mg (has no administration in time range)  ondansetron  (ZOFRAN -ODT) disintegrating tablet 4 mg (4 mg Oral Given 09/13/24 1605)    Initial Impression / Assessment and Plan / UC Course  I have reviewed the triage vital signs and the nursing notes.  Pertinent labs & imaging results that were available during my care of the patient were reviewed by me and considered in my medical decision making (see chart for details).     Vitals and triage  reviewed, patient is hemodynamically stable.  She is tachycardic.  She says she is no longer breast-feeding.  Acetaminophen  and ondansetron  given in clinic.  COVID and flu swabs obtained.  She is positive for influenza A.  Repeat pulse 116-118.  She is given a prescription for Tamiflu  and ondansetron .  Advised supportive care with Tylenol  and/or ibuprofen , guaifenesin, saline nasal spray.  Advised honey water, salt water gargles, cool-mist humidifier.  Plan of care, follow-up care, return precautions given, no questions at this time.  She is given a work note. Final Clinical Impressions(s) / UC Diagnoses   Final diagnoses:  Fever, unspecified  Influenza A     Discharge Instructions      You are positive for influenza A.  You have been given a prescription for Tamiflu .  Take 1 capsule twice a day for 5 days.  You have been given a prescription for ondansetron .  Take 1 tablet every 8 hours as needed for nausea, vomiting.  Influenza is a viral illness which will improve with rest, fluids, and medications to help with your symptoms.  Tylenol  and/or ibuprofen  for fever, body aches, pain. Guaifenesin (plain mucinex) and saline nasal sprays may help  relieve symptoms.  2 teaspoons of honey and 1 cup of warm water every 4-6 hours may help with throat pains.  Salt water gargles, 1/2-1 teaspoon salt dissolved in 1 cup warm water, gargle and spit out several times daily for sore throat.  Humidifier in room at nighttime may help sooth cough (clean well daily).  For chest pain, shortness of breath, inability to keep food or fluids down without vomiting, fever that does not respond to Tylenol  or ibuprofen , or any other severe symptoms, please go to the ER for further evaluation.  Return to urgent care as needed, otherwise follow-up with PCP.      ED Prescriptions     Medication Sig Dispense Auth. Provider   oseltamivir  (TAMIFLU ) 75 MG capsule Take 1 capsule (75 mg total) by mouth every 12  (twelve) hours. 10 capsule Jacquelene Kopecky C, FNP   ondansetron  (ZOFRAN -ODT) 4 MG disintegrating tablet Take 1 tablet (4 mg total) by mouth every 8 (eight) hours as needed for nausea or vomiting. 15 tablet Bella Brummet C, FNP      PDMP not reviewed this encounter.    [1]  Social History Tobacco Use   Smoking status: Never   Smokeless tobacco: Never  Vaping Use   Vaping status: Never Used  Substance Use Topics   Alcohol use: No   Drug use: No     Lennice Jon BROCKS, FNP 09/13/24 1658  "

## 2024-09-13 NOTE — ED Notes (Signed)
No answer in waiting area.

## 2024-09-13 NOTE — ED Triage Notes (Signed)
 Pt has c/o cough, nausea and vomiting,runny nose,congestion, fever, headache and right wrist pain, bilateral shoulder pain x 2 days. Pt hasn't been taking any medications at home for symptoms. Pt states kids were sick, but are getting better.  Interpreter  Erin [phone]
